# Patient Record
Sex: Male | Born: 1995 | Race: White | Hispanic: No | Marital: Single | Smoking: Never smoker
Health system: Southern US, Community
[De-identification: ages and names within clinical notes are randomized; demographics above are authoritative.]

## PROBLEM LIST (undated history)

## (undated) DIAGNOSIS — J020 Streptococcal pharyngitis: Secondary | ICD-10-CM

## (undated) DIAGNOSIS — H6692 Otitis media, unspecified, left ear: Secondary | ICD-10-CM

## (undated) DIAGNOSIS — Z8619 Personal history of other infectious and parasitic diseases: Secondary | ICD-10-CM

## (undated) DIAGNOSIS — F840 Autistic disorder: Secondary | ICD-10-CM

## (undated) DIAGNOSIS — F909 Attention-deficit hyperactivity disorder, unspecified type: Secondary | ICD-10-CM

## (undated) DIAGNOSIS — G2569 Other tics of organic origin: Secondary | ICD-10-CM

## (undated) HISTORY — DX: Other tics of organic origin: G25.69

## (undated) HISTORY — DX: Attention-deficit hyperactivity disorder, unspecified type: F90.9

## (undated) HISTORY — DX: Streptococcal pharyngitis: J02.0

## (undated) HISTORY — DX: Personal history of other infectious and parasitic diseases: Z86.19

## (undated) HISTORY — DX: Otitis media, unspecified, left ear: H66.92

## (undated) HISTORY — DX: Autistic disorder: F84.0

---
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
## 2013-02-12 ENCOUNTER — Telehealth: Payer: Self-pay

## 2013-02-12 DIAGNOSIS — F909 Attention-deficit hyperactivity disorder, unspecified type: Secondary | ICD-10-CM

## 2013-02-12 MED ORDER — AMPHETAMINE-DEXTROAMPHETAMINE 20 MG PO TABS: ORAL_TABLET | ORAL | Status: DC

## 2013-02-12 NOTE — Telephone Encounter (Signed)
Ronald Rivera stating that child needed refill on generic Adderall. She asked that it be mailed to the home. I called mom and let her know that it will be mailed out today and that it is a holiday weekend. She expressed understanding and said that she has enough to get him through until she receives it in the mail.

## 2013-03-25 ENCOUNTER — Telehealth: Payer: Self-pay

## 2013-03-25 DIAGNOSIS — F909 Attention-deficit hyperactivity disorder, unspecified type: Secondary | ICD-10-CM

## 2013-03-25 NOTE — Telephone Encounter (Signed)
[name] lvm stating that child needs Rx for his generic Adderall. She asked that it be mailed. I called and let her know this would be done tomorrow. She expressed understanding.

## 2013-03-26 MED ORDER — AMPHETAMINE-DEXTROAMPHETAMINE 20 MG PO TABS: ORAL_TABLET | ORAL | Status: DC

## 2013-03-26 NOTE — Telephone Encounter (Signed)
Mailed as requested.

## 2013-04-09 ENCOUNTER — Other Ambulatory Visit: Payer: Self-pay

## 2013-04-09 MED ORDER — CLONIDINE HCL 0.1 MG PO TABS: 0.1000 mg | ORAL_TABLET | Freq: Once | ORAL | Status: DC

## 2013-05-19 ENCOUNTER — Telehealth: Payer: Self-pay

## 2013-05-19 DIAGNOSIS — F909 Attention-deficit hyperactivity disorder, unspecified type: Secondary | ICD-10-CM

## 2013-05-19 MED ORDER — AMPHETAMINE-DEXTROAMPHETAMINE 20 MG PO TABS: ORAL_TABLET | ORAL | Status: DC

## 2013-05-19 NOTE — Telephone Encounter (Signed)
Ronald Rivera lvm asking for Rx to be mailed to her home. I called her back and let her know it will go out w tomorrow's mail.

## 2013-07-05 ENCOUNTER — Ambulatory Visit: Payer: 59 | Admitting: Family

## 2013-07-14 ENCOUNTER — Telehealth: Payer: Self-pay

## 2013-07-14 DIAGNOSIS — F909 Attention-deficit hyperactivity disorder, unspecified type: Secondary | ICD-10-CM

## 2013-07-14 MED ORDER — AMPHETAMINE-DEXTROAMPHETAMINE 20 MG PO TABS: ORAL_TABLET | ORAL | Status: DC

## 2013-07-14 NOTE — Telephone Encounter (Signed)
[name],GM, lvm asking for Rx for pt's generic Adderall 20 mg to be mailed to her home. Called her back and let her know we will send it out in the mail tomorrow bc mail has already ran for the day. She expressed understanding.

## 2013-07-26 ENCOUNTER — Ambulatory Visit (INDEPENDENT_AMBULATORY_CARE_PROVIDER_SITE_OTHER): Payer: 59 | Admitting: Family

## 2013-07-26 ENCOUNTER — Encounter: Payer: Self-pay | Admitting: Family

## 2013-07-26 VITALS — BP 108/74 | HR 74 | Ht 68.0 in | Wt 126.4 lb

## 2013-07-26 DIAGNOSIS — F848 Other pervasive developmental disorders: Secondary | ICD-10-CM

## 2013-07-26 DIAGNOSIS — F840 Autistic disorder: Secondary | ICD-10-CM

## 2013-07-26 DIAGNOSIS — G2569 Other tics of organic origin: Secondary | ICD-10-CM

## 2013-07-26 DIAGNOSIS — F909 Attention-deficit hyperactivity disorder, unspecified type: Secondary | ICD-10-CM

## 2013-07-26 NOTE — Progress Notes (Signed)
Patient: Ronald Rivera MRN: [id number] Sex: male DOB: 05/03/1996  Provider: Elveria Rising, NP Location of Care: [facility]  Note type: Routine return visit  History of Present Illness: Referral Source: Dr. Berniece Andreas History from: patient and his grandmother Chief Complaint: Asperger's Disorder/ADD/Motor Tic  Ronald Rivera is a 18 y.o. young man with Asperger syndrome, attention deficit disorder, and motor tic disorder. He has been healthy since last seen. He is in the last year of the [employer] program at [employer] and is looking forward to graduation in June. His family is going to [employer] after his graduation and he is excited about that as well. 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 Ronald Rivera plans to enroll at [employer] in the fall.   Ronald Rivera's grandmother says that he does not take Adderall on weekends and school holidays unless he has something to do that he needs attention and focus. She believes that he could likely reduce the dose or not take it for the purpose of employment. Ronald Rivera's tics have not been problematic. His grandmother are satisfied with his treatment plan at this time.  Review of Systems: 12 system review was unremarkable  Past Medical History  Diagnosis Date  . ADHD (attention deficit hyperactivity disorder)   . Autistic spectrum disorder     sees Dr Sharene Skeans  . Tics of organic origin     hx of  . STREPTOCOCCAL PHARYNGITIS 04/03/2010    Qualifier: Diagnosis of  By: Fabian Sharp MD, Neta Mends   . Hx of varicella   . Left acute otitis media 03/12/2012   Hospitalizations: no, Head Injury: no, Nervous System Infections: no, Immunizations up to date: yes Past Medical History Comments: no serious illness since last seen  Surgical History History reviewed. No pertinent past surgical history.  Family History family history includes Cancer in his paternal  grandmother. Family History is otherwise negative for migraines, seizures, cognitive impairment, blindness, deafness, birth defects, chromosomal disorder, autism.  Social History History   Social History  . Marital Status: Single    Spouse Name: N/A    Number of Children: N/A  . Years of Education: N/A   Social History Main Topics  . Smoking status: Never Smoker   . Smokeless tobacco: Never Used  . Alcohol Use: No  . Drug Use: No  . Sexual Activity: No   Other Topics Concern  . 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 He also enjoys playing video games. School comments: Eman is doing well in school. He graduates from [employer] in June 2015. 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   Physical Exam BP 108/74  Pulse 74  Ht 5\' 8"  (1.727 m)  Wt 126 lb 6.4 oz (57.335 kg)  BMI 19.22 kg/m2 General: alert, well developed, well nourished, blond hair, blue eyes, right-handed, in no acute distress  Head: normocephalic, no dysmorphic features  Ears, Nose and Throat: Otoscopic: tympanic membranes normal . Pharynx: oropharynx is pink without exudates or tonsillar hypertrophy.  Neck: supple, full range of motion, no  cranial or cervical bruits  Respiratory: auscultation clear  Cardiovascular: no murmurs, pulses are normal  Musculoskeletal: no skeletal deformities or apparent scoliosis  Skin: no rashes or neurocutaneous lesions   Neurologic Exam  Mental Status: alert; oriented to person, place; knowledge is below normal for age; language is somewhat concrete. He has a flat affect but was very pleasant and cooperative. eye  contact was intermittent  Cranial Nerves: visual fields are full to double simultaneous stimuli; extraocular movements are full and conjugate; pupils are round reactive to light; funduscopic examination shows sharp disc margins with normal vessels; symmetric facial strength; midline tongue and uvula; hearing is intact and symmetric  Motor: Normal strength, tone, and mass; good fine motor movements; no pronator drift.  Sensory: intact responses to touch and temperature  Coordination: good finger-to-nose, rapid repetitive alternating movements and finger apposition  Gait and Station: normal gait and station; patient is able to walk on heels, toes and tandem without difficulty; balance is adequate; Romberg exam is negative; Gower response is negative  Reflexes: symmetric and diminished bilaterally; no clonus; bilateral flexor plantar responses.   Assessment and Plan Ronald Rivera is a 18 year old young man with history of Asperger syndrome, attention deficit disorder and motor tic disorder. He is doing well at this time. We will make no changes in his treatment plan. He will return for follow up in 1 year or sooner if needed.

## 2013-07-28 ENCOUNTER — Encounter: Payer: Self-pay | Admitting: Family

## 2013-07-28 NOTE — Patient Instructions (Signed)
Congratulations on your upcoming graduation from [employer]! Continue your medications without change.  Please plan to return for follow up in 1 year or sooner if needed.

## 2013-08-11 ENCOUNTER — Telehealth: Payer: Self-pay

## 2013-08-11 DIAGNOSIS — F909 Attention-deficit hyperactivity disorder, unspecified type: Secondary | ICD-10-CM

## 2013-08-11 MED ORDER — AMPHETAMINE-DEXTROAMPHETAMINE 20 MG PO TABS: ORAL_TABLET | ORAL | Status: DC

## 2013-08-11 NOTE — Telephone Encounter (Signed)
Mailed as requested. It is late in the day so it will not go out until tomorrow, weather permitting.

## 2013-08-11 NOTE — Telephone Encounter (Signed)
[name], mom, lvm asking that Rx for pt's generic Adderall 20 mg be mailed to their home. Verified address. I called mom to confirm receipt of her message and let her know that we will put it in the mail. I informed her that the mail is a little delayed due to the weather, and that it may take longer to get there. She expressed understanding.

## 2013-08-16 ENCOUNTER — Other Ambulatory Visit: Payer: Self-pay | Admitting: Neurology

## 2013-10-11 ENCOUNTER — Telehealth: Payer: Self-pay

## 2013-10-11 DIAGNOSIS — F909 Attention-deficit hyperactivity disorder, unspecified type: Secondary | ICD-10-CM

## 2013-10-11 MED ORDER — AMPHETAMINE-DEXTROAMPHETAMINE 20 MG PO TABS: ORAL_TABLET | ORAL | Status: DC

## 2013-10-11 NOTE — Telephone Encounter (Signed)
Mailed as requested.

## 2013-10-11 NOTE — Telephone Encounter (Signed)
Malachi BondsGloria, mom, called and lvm asking for Rx to be mailed to the home. I called mom and let her know that it will be mailed as requested.

## 2013-11-11 ENCOUNTER — Ambulatory Visit (INDEPENDENT_AMBULATORY_CARE_PROVIDER_SITE_OTHER): Payer: 59 | Admitting: Internal Medicine

## 2013-11-11 ENCOUNTER — Encounter: Payer: Self-pay | Admitting: Internal Medicine

## 2013-11-11 VITALS — BP 110/70 | Temp 98.9°F | Ht 68.5 in | Wt 130.0 lb

## 2013-11-11 DIAGNOSIS — J3489 Other specified disorders of nose and nasal sinuses: Secondary | ICD-10-CM

## 2013-11-11 DIAGNOSIS — R0981 Nasal congestion: Secondary | ICD-10-CM

## 2013-11-11 DIAGNOSIS — H109 Unspecified conjunctivitis: Secondary | ICD-10-CM | POA: Insufficient documentation

## 2013-11-11 NOTE — Patient Instructions (Signed)
This doesn't seem like a bacterial infection Could be viral bvut this goes away on its own.  Could be allergic cause you also have nasal stuffiness.  Try OTC generic Claritin of zyrtec  Eye drop such as Optcon a or naphcon a  ( antihistamine ) 3-4 x per day  Or ketotifen every 8 hours   Allergic Conjunctivitis The conjunctiva is a thin membrane that covers the visible white part of the eyeball and the underside of the eyelids. This membrane protects and lubricates the eye. The membrane has small blood vessels running through it that can normally be seen. When the conjunctiva becomes inflamed, the condition is called conjunctivitis. In response to the inflammation, the conjunctival blood vessels become swollen. The swelling results in redness in the normally white part of the eye. The blood vessels of this membrane also react when a person has allergies and is then called allergic conjunctivitis. This condition usually lasts for as long as the allergy persists. Allergic conjunctivitis cannot be passed to another person (non-contagious). The likelihood of bacterial infection is great and the cause is not likely due to allergies if the inflamed eye has:  A sticky discharge.  Discharge or sticking together of the lids in the morning.  Scaling or flaking of the eyelids where the eyelashes come out.  Red swollen eyelids. CAUSES   Viruses.  Irritants such as foreign bodies.  Chemicals.  General allergic reactions.  Inflammation or serious diseases in the inside or the outside of the eye or the orbit (the boney cavity in which the eye sits) can cause a "red eye." SYMPTOMS   Eye redness.  Tearing.  Itchy eyes.  Burning feeling in the eyes.  Clear drainage from the eye.  Allergic reaction due to pollens or ragweed sensitivity. Seasonal allergic conjunctivitis is frequent in the spring when pollens are in the air and in the fall. DIAGNOSIS  This condition, in its many forms, is  usually diagnosed based on the history and an ophthalmological exam. It usually involves both eyes. If your eyes react at the same time every year, allergies may be the cause. While most "red eyes" are due to allergy or an infection, the role of an eye (ophthalmological) exam is important. The exam can rule out serious diseases of the eye or orbit. TREATMENT   Non-antibiotic eye drops, ointments, or medications by mouth may be prescribed if the ophthalmologist is sure the conjunctivitis is due to allergies alone.  Over-the-counter drops and ointments for allergic symptoms should be used only after other causes of conjunctivitis have been ruled out, or as your caregiver suggests. Medications by mouth are often prescribed if other allergy-related symptoms are present. If the ophthalmologist is sure that the conjunctivitis is due to allergies alone, treatment is normally limited to drops or ointments to reduce itching and burning. HOME CARE INSTRUCTIONS   Wash hands before and after applying drops or ointments, or touching the inflamed eye(s) or eyelids.  Do not let the eye dropper tip or ointment tube touch the eyelid when putting medicine in your eye.  Stop using your soft contact lenses and throw them away. Use a new pair of lenses when recovery is complete. You should run through sterilizing cycles at least three times before use after complete recovery if the old soft contact lenses are to be used. Hard contact lenses should be stopped. They need to be thoroughly sterilized before use after recovery.  Itching and burning eyes due to allergies is often relieved  by using a cool cloth applied to closed eye(s). SEEK MEDICAL CARE IF:   Your problems do not go away after two or three days of treatment.  Your lids are sticky (especially in the morning when you wake up) or stick together.  Discharge develops. Antibiotics may be needed either as drops, ointment, or by mouth.  You have extreme light  sensitivity.  An oral temperature above 102 F (38.9 C) develops.  Pain in or around the eye or any other visual symptom develops. MAKE SURE YOU:   Understand these instructions.  Will watch your condition.  Will get help right away if you are not doing well or get worse. Document Released: 08/24/2002 Document Revised: 08/26/2011 Document Reviewed: 07/20/2007 [facility]: [facility]onda W Patient Information 2014 [place], [place].

## 2013-11-11 NOTE — Progress Notes (Signed)
Chief Complaint  Patient presents with  . Eye Problem    Left eye is red.  Ongoing for 3-4 days.  Has tried OTC Visine.    HPI: Patient comes in today for SDA for  new problem evaluation. Here with grandmother Using visine for irritation. And going on 3-4 days but he says he has been sick Red left eye no pain or fever .   No discharge Congested .  Not sick with this  No change vision ono discharge a specific diagnosis of allergy but was in the yard a lot recently. ROS: See pertinent positives and negatives per HPI.  Past Medical History  Diagnosis Date  . ADHD (attention deficit hyperactivity disorder)   . Autistic spectrum disorder     sees Dr Sharene Skeans  . Tics of organic origin     hx of  . STREPTOCOCCAL PHARYNGITIS 04/03/2010    Qualifier: Diagnosis of  By: Fabian Sharp MD, Neta Mends   . Hx of varicella   . Left acute otitis media 03/12/2012    Family History  Problem Relation Age of Onset  . Cancer Paternal Grandmother     Died in her 25's    History   Social History  . Marital Status: Single    Spouse Name: N/A    Number of Children: N/A  . Years of Education: N/A   Social History Main Topics  . Smoking status: Never Smoker   . Smokeless tobacco: Never Used  . Alcohol Use: No  . Drug Use: No  . Sexual Activity: No   Other Topics Concern  . None   Social History Narrative   Single student   Gm helps with caretaking   [employer] 12th grade     Outpatient Encounter Prescriptions as of 11/11/2013  Medication Sig  . amphetamine-dextroamphetamine (ADDERALL) 20 MG tablet Take 1 1/2 tabs po qam &  1/2 tab po prn at 4PM  . cloNIDine (CATAPRES) 0.1 MG tablet TAKE 1/2 TABLET BY MOUTH EVERY MORNING & 1/2 TABLET EVERY EVENING    EXAM:  BP 110/70  Temp(Src) 98.9 F (37.2 C) (Oral)  Ht 5' 8.5" (1.74 m)  Wt 130 lb (58.968 kg)  BMI 19.48 kg/m2  Body mass index is 19.48 kg/(m^2).  GENERAL: vitals reviewed and listed above, alert, oriented, appears well hydrated  and in no acute distress with some mild conjunctival injection left greater than right as well HEENT: atraumatic, conjunctiva  no ciliary flush or discharge noted EOMs are full PERRLA no photophobia, no obvious abnormalities on inspection of external nose and ears is some mild nasal congestion OP : no lesion edema or exudate  NECK: no obvious masses on inspection palpation no adenopathy LUNGS: clear to auscultation bilaterally, no wheezes, rales or rhonchi CV: HRRR, no clubbing cyanosis or  peripheral edema nl cap refill  MS: moves all extremities without noticeable focal  abnormality PSYCH: pleasant and cooperative, slightly decreased eye contact ASSESSMENT AND PLAN:  Discussed the following assessment and plan:  Conjunctivitis unspecified - Allergic versus other it doesn't look bacterial discussed options be aware of rebound with antihistamines try OTC antihistamines  Nasal congestion  -Patient advised to return or notify health care team  if symptoms worsen ,persist or new concerns arise.  Patient Instructions  This doesn't seem like a bacterial infection Could be viral bvut this goes away on its own.  Could be allergic cause you also have nasal stuffiness.  Try OTC generic Claritin of zyrtec  Eye drop such  as Optcon a or naphcon a  ( antihistamine ) 3-4 x per day  Or ketotifen every 8 hours   Allergic Conjunctivitis The conjunctiva is a thin membrane that covers the visible white part of the eyeball and the underside of the eyelids. This membrane protects and lubricates the eye. The membrane has small blood vessels running through it that can normally be seen. When the conjunctiva becomes inflamed, the condition is called conjunctivitis. In response to the inflammation, the conjunctival blood vessels become swollen. The swelling results in redness in the normally white part of the eye. The blood vessels of this membrane also react when a person has allergies and is then called  allergic conjunctivitis. This condition usually lasts for as long as the allergy persists. Allergic conjunctivitis cannot be passed to another person (non-contagious). The likelihood of bacterial infection is great and the cause is not likely due to allergies if the inflamed eye has:  A sticky discharge.  Discharge or sticking together of the lids in the morning.  Scaling or flaking of the eyelids where the eyelashes come out.  Red swollen eyelids. CAUSES   Viruses.  Irritants such as foreign bodies.  Chemicals.  General allergic reactions.  Inflammation or serious diseases in the inside or the outside of the eye or the orbit (the boney cavity in which the eye sits) can cause a "red eye." SYMPTOMS   Eye redness.  Tearing.  Itchy eyes.  Burning feeling in the eyes.  Clear drainage from the eye.  Allergic reaction due to pollens or ragweed sensitivity. Seasonal allergic conjunctivitis is frequent in the spring when pollens are in the air and in the fall. DIAGNOSIS  This condition, in its many forms, is usually diagnosed based on the history and an ophthalmological exam. It usually involves both eyes. If your eyes react at the same time every year, allergies may be the cause. While most "red eyes" are due to allergy or an infection, the role of an eye (ophthalmological) exam is important. The exam can rule out serious diseases of the eye or orbit. TREATMENT   Non-antibiotic eye drops, ointments, or medications by mouth may be prescribed if the ophthalmologist is sure the conjunctivitis is due to allergies alone.  Over-the-counter drops and ointments for allergic symptoms should be used only after other causes of conjunctivitis have been ruled out, or as your caregiver suggests. Medications by mouth are often prescribed if other allergy-related symptoms are present. If the ophthalmologist is sure that the conjunctivitis is due to allergies alone, treatment is normally limited to  drops or ointments to reduce itching and burning. HOME CARE INSTRUCTIONS   Wash hands before and after applying drops or ointments, or touching the inflamed eye(s) or eyelids.  Do not let the eye dropper tip or ointment tube touch the eyelid when putting medicine in your eye.  Stop using your soft contact lenses and throw them away. Use a new pair of lenses when recovery is complete. You should run through sterilizing cycles at least three times before use after complete recovery if the old soft contact lenses are to be used. Hard contact lenses should be stopped. They need to be thoroughly sterilized before use after recovery.  Itching and burning eyes due to allergies is often relieved by using a cool cloth applied to closed eye(s). SEEK MEDICAL CARE IF:   Your problems do not go away after two or three days of treatment.  Your lids are sticky (especially in the morning  when you wake up) or stick together.  Discharge develops. Antibiotics may be needed either as drops, ointment, or by mouth.  You have extreme light sensitivity.  An oral temperature above 102 F (38.9 C) develops.  Pain in or around the eye or any other visual symptom develops. MAKE SURE YOU:   Understand these instructions.  Will watch your condition.  Will get help right away if you are not doing well or get worse. Document Released: 08/24/2002 Document Revised: 08/26/2011 Document Reviewed: 07/20/2007 [facility] Patient Information 2014 [place], [place].      Neta Mends. Asier Desroches M.D.  Pre visit review using our clinic review tool, if applicable. No additional management support is needed unless otherwise documented below in the visit note.

## 2013-11-23 ENCOUNTER — Telehealth: Payer: Self-pay | Admitting: Internal Medicine

## 2013-11-23 MED ORDER — POLYMYXIN B-TRIMETHOPRIM 10000-0.1 UNIT/ML-% OP SOLN: 1.0000 [drp] | OPHTHALMIC | Status: DC

## 2013-11-23 NOTE — Telephone Encounter (Signed)
Spoke to the patient's grandmother.  She says the pt has no fever or vision changes. Medication sent to [facility] [address].

## 2013-11-23 NOTE — Telephone Encounter (Signed)
Pt instructed to cb concerning eye. Grandmother states that pt's eye is not better, still red and discharge is coming from eye. Will need the rx dropps. [facility]

## 2013-11-23 NOTE — Telephone Encounter (Signed)
If no fever or vision change  Send in polytrim eye drops 1 drop qid to affected eye  disp 1 bottle  If still not better may need rov or eye doctor check

## 2013-12-16 ENCOUNTER — Telehealth: Payer: Self-pay

## 2013-12-16 DIAGNOSIS — F909 Attention-deficit hyperactivity disorder, unspecified type: Secondary | ICD-10-CM

## 2013-12-16 MED ORDER — AMPHETAMINE-DEXTROAMPHETAMINE 20 MG PO TABS: ORAL_TABLET | ORAL | Status: DC

## 2013-12-16 NOTE — Telephone Encounter (Signed)
Ronald Rivera lvm asking that pt's Rx for generic Adderall 20 mg be mailed to their home. I called mom to remind her that it is a holiday weekend and the mail may be delayed. She expressed understanding.

## 2014-02-10 ENCOUNTER — Other Ambulatory Visit: Payer: Self-pay | Admitting: Family

## 2014-02-10 DIAGNOSIS — F909 Attention-deficit hyperactivity disorder, unspecified type: Secondary | ICD-10-CM

## 2014-02-10 MED ORDER — AMPHETAMINE-DEXTROAMPHETAMINE 20 MG PO TABS: ORAL_TABLET | ORAL | Status: DC

## 2014-02-10 NOTE — Telephone Encounter (Signed)
Mom left a message requesting that the generic Adderall Rx be mailed. I called and let her know that I would send her the Rx. TG

## 2014-04-28 ENCOUNTER — Telehealth: Payer: Self-pay

## 2014-04-28 DIAGNOSIS — F909 Attention-deficit hyperactivity disorder, unspecified type: Secondary | ICD-10-CM

## 2014-04-28 MED ORDER — AMPHETAMINE-DEXTROAMPHETAMINE 20 MG PO TABS: ORAL_TABLET | ORAL | Status: DC

## 2014-04-28 NOTE — Telephone Encounter (Signed)
Mailed as requested.

## 2014-04-28 NOTE — Telephone Encounter (Signed)
Malachi BondsGloria, mom, requesting Rx for child generic Adderall to be mailed to their home. I confirmed address and let her know we will place in the mail as requested.

## 2014-06-27 ENCOUNTER — Other Ambulatory Visit: Payer: Self-pay

## 2014-06-27 DIAGNOSIS — F909 Attention-deficit hyperactivity disorder, unspecified type: Secondary | ICD-10-CM

## 2014-06-27 MED ORDER — AMPHETAMINE-DEXTROAMPHETAMINE 20 MG PO TABS: ORAL_TABLET | ORAL | Status: DC

## 2014-06-27 NOTE — Telephone Encounter (Signed)
Malachi BondsGloria, called and asked that we mail Rx for pt's Adderall 20 mg. I scheduled pt's f/u while I had her on the phone, 07/27/14 w Inetta Fermoina.

## 2014-06-27 NOTE — Telephone Encounter (Signed)
Mailed as requested.

## 2014-07-27 ENCOUNTER — Ambulatory Visit (INDEPENDENT_AMBULATORY_CARE_PROVIDER_SITE_OTHER): Payer: 59 | Admitting: Family

## 2014-07-27 ENCOUNTER — Encounter: Payer: Self-pay | Admitting: Family

## 2014-07-27 VITALS — BP 110/76 | HR 80 | Ht 67.75 in | Wt 131.4 lb

## 2014-07-27 DIAGNOSIS — F909 Attention-deficit hyperactivity disorder, unspecified type: Secondary | ICD-10-CM | POA: Diagnosis not present

## 2014-07-27 DIAGNOSIS — G2569 Other tics of organic origin: Secondary | ICD-10-CM

## 2014-07-27 DIAGNOSIS — F840 Autistic disorder: Secondary | ICD-10-CM

## 2014-07-27 MED ORDER — AMPHETAMINE-DEXTROAMPHETAMINE 20 MG PO TABS: ORAL_TABLET | ORAL | Status: DC

## 2014-07-27 MED ORDER — CLONIDINE HCL 0.1 MG PO TABS: ORAL_TABLET | ORAL | Status: DC

## 2014-07-27 NOTE — Patient Instructions (Signed)
Ronald JunesBrandon should continues his generic Adderall and Clonidine as he has been taking them.  I will see him back in follow up in 1 year or sooner if needed.

## 2014-07-27 NOTE — Progress Notes (Signed)
Patient: Ronald Rivera Remmel MRN: [id number] Sex: male DOB: 04/24/1996  Provider: Elveria RisingGOODPASTURE, Shela Esses, NP [facility]  Note type: Routine return visit  History of Present Illness: Referral Source: Dr. Burna MortimerWanda Posh History from: his grandmother Chief Complaint: Tics, Autism, ADHD  Ronald Rivera Self is a 19 y.o. with history of Asperger syndrome, attention deficit disorder, and motor tic disorder. He was last seen July 26, 2013. 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 Ronald Rivera now has a checking and savings account, and his grandmother says that he is managing both independently. Ronald Rivera continues to take generic Adderall and Clonidine for his problems with attention. Grandmother says that these medications continue to work to help him focus his attention in his job. She sometimes does not give it to him on weekends, and says that without it that he is very distractible and active.   Ronald Rivera has been healthy since last seen. He has history of tics but they are not problematic at this time.   Review of Systems: 12 system review was unremarkable  Past Medical History  Diagnosis Date  . ADHD (attention deficit hyperactivity disorder)   . Autistic spectrum disorder     sees Dr Sharene SkeansHickling  . Tics of organic origin     hx of  . STREPTOCOCCAL PHARYNGITIS 04/03/2010    Qualifier: Diagnosis of  By: Fabian SharpPanosh MD, Neta MendsWanda K   . Hx of varicella   . Left acute otitis media 03/12/2012   Hospitalizations: No., Head Injury: No., Nervous System Infections: No., Immunizations up to date: Yes.   Past Medical History Comments: See hx.  Surgical History History reviewed. No pertinent past surgical history.  Family History family history includes Cancer in his paternal grandmother. Family History is otherwise negative for  migraines, seizures, cognitive impairment, blindness, deafness, birth defects, chromosomal disorder, autism.  Social History History   Social History  . Marital Status: Single    Spouse Name: N/A  . Number of Children: N/A  . Years of Education: N/A   Social History Main Topics  . Smoking status: Never Smoker   . Smokeless tobacco: Never Used  . Alcohol Use: No  . Drug Use: No  . Sexual Activity: No   Other Topics Concern  . 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 Pharynx: oropharynx is pink without exudates or tonsillar hypertrophy.  Neck: supple, full range of motion, no cranial or cervical bruits  Respiratory: auscultation clear  Cardiovascular: no murmurs, pulses are normal  Musculoskeletal: no skeletal deformities or apparent scoliosis  Skin: no rashes or neurocutaneous lesions   Neurologic Exam  Mental Status: alert; oriented to person, place; knowledge is below normal for age; language is somewhat concrete. He  has a flat affect but was very pleasant and cooperative. Eye contact was intermittent  Cranial Nerves: visual fields are full to double simultaneous stimuli; extraocular movements are full and conjugate; pupils are round reactive to light; funduscopic examination shows sharp disc margins with normal vessels;  symmetric facial strength; midline tongue and uvula; hearing is intact and symmetric  Motor: Normal strength, tone, and mass; good fine motor movements; no pronator drift.  Sensory: intact responses to touch and temperature  Coordination: good finger-to-nose, rapid repetitive alternating movements and finger apposition  Gait and Station: normal gait and station; patient is able to walk on heels, toes and tandem without difficulty; balance is adequate; Romberg exam is negative; Gower response is negative  Reflexes: symmetric and diminished bilaterally; no clonus; bilateral flexor plantar responses.   Assessment and Plan Ronald Junes is a 19 year old young man with history of Asperger syndrome, attention deficit disorder and motor tic disorder. He is doing well at this time on generic Adderall and Clonidine. We will make no changes in his treatment plan. He will return for follow up in 1 year or sooner if needed. Ronald Junes and his grandmother agreed with these plans.

## 2014-09-13 ENCOUNTER — Emergency Department (HOSPITAL_COMMUNITY)
Admission: EM | Admit: 2014-09-13 | Discharge: 2014-09-13 | Disposition: A | Discharge status: Home / Self Care | Payer: 59 | Attending: Emergency Medicine | Admitting: Emergency Medicine

## 2014-09-13 ENCOUNTER — Telehealth: Payer: Self-pay | Admitting: Internal Medicine

## 2014-09-13 ENCOUNTER — Emergency Department (HOSPITAL_COMMUNITY): Payer: 59

## 2014-09-13 ENCOUNTER — Encounter (HOSPITAL_COMMUNITY): Payer: Self-pay | Admitting: Emergency Medicine

## 2014-09-13 DIAGNOSIS — Z8669 Personal history of other diseases of the nervous system and sense organs: Secondary | ICD-10-CM | POA: Insufficient documentation

## 2014-09-13 DIAGNOSIS — Z8619 Personal history of other infectious and parasitic diseases: Secondary | ICD-10-CM | POA: Insufficient documentation

## 2014-09-13 DIAGNOSIS — R1013 Epigastric pain: Secondary | ICD-10-CM | POA: Diagnosis present

## 2014-09-13 DIAGNOSIS — F840 Autistic disorder: Secondary | ICD-10-CM | POA: Diagnosis not present

## 2014-09-13 DIAGNOSIS — Z79899 Other long term (current) drug therapy: Secondary | ICD-10-CM | POA: Diagnosis not present

## 2014-09-13 DIAGNOSIS — F909 Attention-deficit hyperactivity disorder, unspecified type: Secondary | ICD-10-CM | POA: Diagnosis not present

## 2014-09-13 DIAGNOSIS — K529 Noninfective gastroenteritis and colitis, unspecified: Secondary | ICD-10-CM | POA: Diagnosis not present

## 2014-09-13 DIAGNOSIS — Z8709 Personal history of other diseases of the respiratory system: Secondary | ICD-10-CM | POA: Diagnosis not present

## 2014-09-13 LAB — CBC WITH DIFFERENTIAL/PLATELET
Basophils Absolute: 0 10*3/uL (ref 0.0–0.1)
Basophils Relative: 0 % (ref 0–1)
EOS PCT: 0 % (ref 0–5)
Eosinophils Absolute: 0 10*3/uL (ref 0.0–0.7)
HCT: 46.4 % (ref 39.0–52.0)
Hemoglobin: 15.8 g/dL (ref 13.0–17.0)
LYMPHS PCT: 5 % — AB (ref 12–46)
Lymphs Abs: 1 10*3/uL (ref 0.7–4.0)
MCH: 29.5 pg (ref 26.0–34.0)
MCHC: 34.1 g/dL (ref 30.0–36.0)
MCV: 86.6 fL (ref 78.0–100.0)
Monocytes Absolute: 0.9 10*3/uL (ref 0.1–1.0)
Monocytes Relative: 5 % (ref 3–12)
Neutro Abs: 17.3 10*3/uL — ABNORMAL HIGH (ref 1.7–7.7)
Neutrophils Relative %: 90 % — ABNORMAL HIGH (ref 43–77)
PLATELETS: 255 10*3/uL (ref 150–400)
RBC: 5.36 MIL/uL (ref 4.22–5.81)
RDW: 13 % (ref 11.5–15.5)
WBC: 19.2 10*3/uL — ABNORMAL HIGH (ref 4.0–10.5)

## 2014-09-13 LAB — COMPREHENSIVE METABOLIC PANEL
ALBUMIN: 4.5 g/dL (ref 3.5–5.2)
ALT: 16 U/L (ref 0–53)
ANION GAP: 13 (ref 5–15)
AST: 25 U/L (ref 0–37)
Alkaline Phosphatase: 77 U/L (ref 39–117)
BILIRUBIN TOTAL: 0.6 mg/dL (ref 0.3–1.2)
BUN: 20 mg/dL (ref 6–23)
CALCIUM: 9.9 mg/dL (ref 8.4–10.5)
CO2: 23 mmol/L (ref 19–32)
Chloride: 102 mmol/L (ref 96–112)
Creatinine, Ser: 1.1 mg/dL (ref 0.50–1.35)
GFR calc Af Amer: >90 mL/min (ref >90)
GLUCOSE: 191 mg/dL — AB (ref 70–99)
Potassium: 4.4 mmol/L (ref 3.5–5.1)
Sodium: 138 mmol/L (ref 135–145)
Total Protein: 7.2 g/dL (ref 6.0–8.3)

## 2014-09-13 MED ORDER — IOHEXOL 300 MG/ML  SOLN
25.0000 mL | Freq: Once | INTRAMUSCULAR | Status: AC | PRN
  Administered 2014-09-13: 25 mL via ORAL

## 2014-09-13 MED ORDER — ONDANSETRON 4 MG PO TBDP: 4.0000 mg | ORAL_TABLET | Freq: Three times a day (TID) | ORAL | Status: DC | PRN

## 2014-09-13 MED ORDER — METRONIDAZOLE 500 MG PO TABS: 500.0000 mg | ORAL_TABLET | Freq: Two times a day (BID) | ORAL | Status: DC

## 2014-09-13 MED ORDER — IOHEXOL 300 MG/ML  SOLN
100.0000 mL | Freq: Once | INTRAMUSCULAR | Status: AC | PRN
  Administered 2014-09-13: 100 mL via INTRAVENOUS

## 2014-09-13 MED ORDER — ONDANSETRON HCL 4 MG/2ML IJ SOLN
4.0000 mg | Freq: Once | INTRAMUSCULAR | Status: AC
  Administered 2014-09-13: 4 mg via INTRAVENOUS
  Filled 2014-09-13: qty 2

## 2014-09-13 MED ORDER — CIPROFLOXACIN HCL 500 MG PO TABS: 500.0000 mg | ORAL_TABLET | Freq: Two times a day (BID) | ORAL | Status: DC

## 2014-09-13 MED ORDER — SODIUM CHLORIDE 0.9 % IV BOLUS (SEPSIS)
2000.0000 mL | Freq: Once | INTRAVENOUS | Status: AC
  Administered 2014-09-13: 2000 mL via INTRAVENOUS

## 2014-09-13 NOTE — ED Provider Notes (Signed)
CSN: [id number]     Arrival date & time 09/13/14  1622 History   First MD Initiated Contact with Patient 09/13/14 1632     Chief Complaint  Patient presents with  . Abdominal Pain     (Consider location/radiation/quality/duration/timing/severity/associated sxs/prior Treatment) Patient is a 19 y.o. male presenting with vomiting. The history is provided by the patient and a parent.  Emesis Severity:  Moderate Duration:  15 hours Timing:  Constant Quality:  Stomach contents Progression:  Unchanged Chronicity:  New Recent urination:  Decreased Relieved by:  None tried Worsened by:  Nothing tried Ineffective treatments:  None tried Associated symptoms: abdominal pain (epigastric, improved)   Associated symptoms: no diarrhea, no fever, no headaches, no sore throat and no URI     Past Medical History  Diagnosis Date  . ADHD (attention deficit hyperactivity disorder)   . Autistic spectrum disorder     sees Dr Sharene Skeans  . Tics of organic origin     hx of  . STREPTOCOCCAL PHARYNGITIS 04/03/2010    Qualifier: Diagnosis of  By: Fabian Sharp MD, Neta Mends   . Hx of varicella   . Left acute otitis media 03/12/2012   History reviewed. No pertinent past surgical history. Family History  Problem Relation Age of Onset  . Cancer Paternal Grandmother     Died in her 56's   History  Substance Use Topics  . Smoking status: Never Smoker   . Smokeless tobacco: Never Used  . Alcohol Use: No    Review of Systems  HENT: Negative for sore throat.   Gastrointestinal: Positive for vomiting and abdominal pain (epigastric, improved). Negative for diarrhea.  Neurological: Negative for headaches.  All other systems reviewed and are negative.     Allergies  Aripiprazole  Home Medications   Prior to Admission medications   Medication Sig Start Date End Date Taking? Authorizing Provider  acetaminophen (TYLENOL) 500 MG tablet Take 1,000 mg by mouth every 6 (six) hours as needed for headache.    Yes Historical Provider, MD  amphetamine-dextroamphetamine (ADDERALL) 20 MG tablet Take 10 mg by mouth daily.   Yes Historical Provider, MD  cloNIDine (CATAPRES) 0.1 MG tablet Take 0.05 mg by mouth daily.   Yes Historical Provider, MD  amphetamine-dextroamphetamine (ADDERALL) 20 MG tablet Take 1 1/2 tabs po qam &  1/2 tab po prn at 4PM 07/27/14   Princella Ion, NP  ciprofloxacin (CIPRO) 500 MG tablet Take 1 tablet (500 mg total) by mouth every 12 (twelve) hours. 09/13/14   Dorna Leitz, MD  cloNIDine (CATAPRES) 0.1 MG tablet TAKE 1/2 TABLET BY MOUTH EVERY MORNING & 1/2 TABLET EVERY EVENING 07/27/14   Princella Ion, NP  metroNIDAZOLE (FLAGYL) 500 MG tablet Take 1 tablet (500 mg total) by mouth 2 (two) times daily. 09/13/14   Dorna Leitz, MD  ondansetron (ZOFRAN ODT) 4 MG disintegrating tablet Take 1 tablet (4 mg total) by mouth every 8 (eight) hours as needed for nausea. 09/13/14   Dorna Leitz, MD  trimethoprim-polymyxin b (POLYTRIM) ophthalmic solution Place 1 drop into the left eye every 4 (four) hours. Patient not taking: Reported on 07/27/2014 11/23/13   [name], MD   BP 111/62 mmHg  Pulse 90  Temp(Src) 98.3 F (36.8 C) (Oral)  Resp 16  SpO2 96% Physical Exam  Constitutional: He is oriented to person, place, and time. He appears well-developed and well-nourished. No distress.  Pale, clammy, diaphoretic. No acute distress on my exam.  HENT:  Head: Normocephalic and  atraumatic.  Mouth/Throat: No oropharyngeal exudate.  Mucous membranes dry  Eyes: Conjunctivae and EOM are normal. Pupils are equal, round, and reactive to light.  Neck: Normal range of motion. Neck supple.  Cardiovascular: Normal rate, regular rhythm, normal heart sounds and intact distal pulses.  Exam reveals no gallop and no friction rub.   No murmur heard. Pulmonary/Chest: Effort normal and breath sounds normal. No respiratory distress. He has no wheezes. He has no rales.  Abdominal: Soft. He exhibits no  distension and no mass. There is no tenderness. There is no rebound and no guarding.  Nontender throughout abdomen to deep palpation  Musculoskeletal: Normal range of motion. He exhibits no edema or tenderness.  Lymphadenopathy:    He has no cervical adenopathy.  Neurological: He is alert and oriented to person, place, and time. No cranial nerve deficit.  Skin: Skin is warm. No rash noted. He is diaphoretic. There is pallor.  Psychiatric: He has a normal mood and affect. His behavior is normal. Judgment and thought content normal.  Nursing note and vitals reviewed.   ED Course  Procedures (including critical care time) Labs Review Labs Reviewed  CBC WITH DIFFERENTIAL/PLATELET - Abnormal; Notable for the following:    WBC 19.2 (*)    Neutrophils Relative % 90 (*)    Neutro Abs 17.3 (*)    Lymphocytes Relative 5 (*)    All other components within normal limits  COMPREHENSIVE METABOLIC PANEL - Abnormal; Notable for the following:    Glucose, Bld 191 (*)    All other components within normal limits    Imaging Review Ct Abdomen Pelvis W Contrast  09/13/2014   CLINICAL DATA:  Diffuse abdominal pain, with nausea and vomiting for 1 day.  EXAM: CT ABDOMEN AND PELVIS WITH CONTRAST  TECHNIQUE: Multidetector CT imaging of the abdomen and pelvis was performed using the standard protocol following bolus administration of intravenous contrast.  CONTRAST:  OMNIPAQUE IOHEXOL 300 MG/ML  SOLN  COMPARISON:  None.  FINDINGS: The included lung bases are clear.  The gallbladder is physiologically distended, there is probable gallbladder wall thickening measuring up to 5.5 mm. No definite calcified gallstone. The common bile duct appears normal in caliber, there is mild periportal edema. No focal hepatic lesion. The spleen, pancreas, and adrenal glands are normal. The kidneys are symmetric in size with symmetric enhancement. No hydronephrosis or localizing renal abnormality.  Stomach is physiologically  distended with contrast. There are no dilated or thickened bowel loops. The appendix is normal, best appreciated on coronal reformat. There is mild colonic wall thickening involving the ascending colon. Small volume of stool throughout the remainder the colon. No free air, free fluid, or intra-abdominal fluid collection.  Abdominal aorta is normal in caliber. No retroperitoneal adenopathy.  Within the pelvis the urinary bladder is physiologically distended. There is no pelvic free fluid. No pelvic adenopathy.  There are no acute or suspicious osseous abnormalities.  IMPRESSION: 1. Mild colonic wall thickening of the ascending colon consistent with colitis. This may be infectious or inflammatory. 2. Physiologically distended gallbladder with mild gallbladder wall thickening. This may be reactive related to the colonic process, however right upper quadrant ultrasound could be considered if there is clinical concern for hepatobiliary pathology.   Electronically Signed   By: Rubye Oaks M.D.   On: 09/13/2014 20:15     EKG Interpretation None      MDM   Final diagnoses:  Gastroenteritis  Colitis   19 year old male presents with nausea and  vomiting since 1:00 AM this morning. No new exposures and no sick contacts. Denies diarrhea. Did have epigastric abdominal pain with vomiting that has improved at this point. He has no abdominal tenderness on my exam throughout. At this point, we'll defer imaging as this appears to be a gastritis. Lack of abdominal tenderness or vital sign abnormality makes acute abdominal process much less likely. We'll do serial abdominal exams throughout emergency department stay. Afebrile and vital signs stable. Fluid hydration, Zofran, basic labs.  5:46 PM  repeat abdominal exam remains benign to deep palpation throughout. Leukocytosis likely stress reactive in nature as he is also hyperglycemic. Symptomatically much improved with fluids and Zofran. Vital signs remain stable.  No further diaphoresis and nausea is improved. Given possible unreliability of history given autism and report of right lower quadrant pain from grandmother, CT abdomen and pelvis ordered.  9:00 PM CT reflective of colitis. On repeat exam, patient tolerating by mouth and much improved. Likely viral, but cannot rule out bacterial and we'll treat with Cipro Flagyl in addition to Zofran and oral rehydration. PCP follow-up.   Dorna LeitzAlex Prakriti Carignan, MD 09/13/14 [id number]  Blake DivineJohn Wofford, MD 09/13/14 [phone]

## 2014-09-13 NOTE — ED Notes (Signed)
Per patient, started having abdominal pain generalized since this morning. Pt is pale and diaphoretic, AAOX4

## 2014-09-13 NOTE — ED Notes (Signed)
Pt tolerating PO fluids

## 2014-09-13 NOTE — ED Provider Notes (Signed)
I saw and evaluated the patient, reviewed the resident's note and I agree with the findings and plan.   EKG Interpretation None      19 yo male with Autism presenting with vomiting starting early this morning.  On exam, well appearing, nontoxic, not distressed, normal respiratory effort, normal perfusion, abdomen soft, but mildly tender throughout, mild guarding, no rebound or rigidity.  Feels much better after IV fluids, but history is somewhat difficult to obtain from him.  His grandmother reports significant abdominal pain earlier.  Also has leukocytosis.  Plan CT.    CT shows colitis. DC with antibiotic.  Clinical Impression: 1. Gastroenteritis   2. Colitis       Blake DivineJohn Maximos Zayas, MD 09/13/14 [phone]

## 2014-09-13 NOTE — Discharge Instructions (Signed)

## 2014-09-13 NOTE — Telephone Encounter (Incomplete)
[facility] - Client TELEPHONE ADVICE RECORD [facility] Patient Name: Ronald Rivera DOB: 06/12/1996 Initial Comment ***Next Attempt Due at 3:05pm***Caller states her grandson has been vomiting since this morning. Nurse Assessment Nurse: Orvis Brillowland, RN, Olegario MessierKathy Date/Time Ronald Rivera(Eastern Time): 09/13/2014 3:31:19 PM Confirm and document reason for call. If symptomatic, describe symptoms. ---Caller states that he had onset of vomiting without diarrhea approx 0100 today & has been vomiting every 45" since then. Had onset of constant abd pain this AM, is below navel area. No fever. Has the patient traveled out of the country within the last 30 days? ---No Does the patient require triage? ---Yes Related visit to physician within the last 2 weeks? ---No Does the PT have any chronic conditions? (i.e. diabetes, asthma, etc.) ---Yes List chronic conditions. ---Asperger's Syndrome Guidelines Guideline Title Affirmed Question Affirmed Notes Abdominal Pain - Male [1] MILD-MODERATE pain AND [2] constant AND [3] present > 2 hours Final Disposition User See Physician within 4 Hours (or PCP triage) Orvis Brillowland, RN, Olegario MessierKathy Comments Caller states she missed a call from the office Triager did not attempt to schedule appt secondary to time call back made & fact that caller has been vomiting & having constant abd pain since early this AM.

## 2014-09-13 NOTE — ED Notes (Signed)
CT notified patient finished with contrast 

## 2014-09-26 ENCOUNTER — Telehealth: Payer: Self-pay

## 2014-09-26 DIAGNOSIS — F909 Attention-deficit hyperactivity disorder, unspecified type: Secondary | ICD-10-CM

## 2014-09-26 MED ORDER — AMPHETAMINE-DEXTROAMPHETAMINE 20 MG PO TABS: ORAL_TABLET | ORAL | Status: DC

## 2014-09-26 NOTE — Telephone Encounter (Signed)
Mailed as requested.

## 2014-09-26 NOTE — Telephone Encounter (Signed)
Ronald BondsGloria called and requested pt's Rx for generic Adderall 20 mg to be mailed to their home. She confirmed sig, and address. I let her know that we will mail as requested.

## 2014-09-27 ENCOUNTER — Ambulatory Visit (INDEPENDENT_AMBULATORY_CARE_PROVIDER_SITE_OTHER): Payer: 59 | Admitting: Internal Medicine

## 2014-09-27 ENCOUNTER — Encounter: Payer: Self-pay | Admitting: Internal Medicine

## 2014-09-27 VITALS — BP 114/64 | Temp 98.5°F | Ht 67.75 in | Wt 132.0 lb

## 2014-09-27 DIAGNOSIS — D72829 Elevated white blood cell count, unspecified: Secondary | ICD-10-CM | POA: Diagnosis not present

## 2014-09-27 DIAGNOSIS — K529 Noninfective gastroenteritis and colitis, unspecified: Secondary | ICD-10-CM | POA: Diagnosis not present

## 2014-09-27 LAB — CBC WITH DIFFERENTIAL/PLATELET
Basophils Absolute: 0.1 10*3/uL (ref 0.0–0.1)
Basophils Relative: 0.7 % (ref 0.0–3.0)
EOS ABS: 0.2 10*3/uL (ref 0.0–0.7)
Eosinophils Relative: 2.2 % (ref 0.0–5.0)
HCT: 46 % (ref 39.0–52.0)
Hemoglobin: 15.5 g/dL (ref 13.0–17.0)
Lymphocytes Relative: 21.4 % (ref 12.0–46.0)
Lymphs Abs: 2 10*3/uL (ref 0.7–4.0)
MCHC: 33.7 g/dL (ref 30.0–36.0)
MCV: 85.9 fl (ref 78.0–100.0)
MONO ABS: 1 10*3/uL (ref 0.1–1.0)
Monocytes Relative: 10.2 % (ref 3.0–12.0)
NEUTROS PCT: 65.5 % (ref 43.0–77.0)
Neutro Abs: 6.2 10*3/uL (ref 1.4–7.7)
PLATELETS: 351 10*3/uL (ref 150.0–400.0)
RBC: 5.36 Mil/uL (ref 4.22–5.81)
RDW: 13.6 % (ref 11.5–14.6)
WBC: 9.5 10*3/uL (ref 4.5–10.5)

## 2014-09-27 LAB — LIPID PANEL
CHOLESTEROL: 140 mg/dL (ref 0–200)
HDL: 35.5 mg/dL — AB (ref >39.00)
LDL CALC: 88 mg/dL (ref 0–99)
NonHDL: 104.5
Total CHOL/HDL Ratio: 4
Triglycerides: 83 mg/dL (ref 0.0–149.0)
VLDL: 16.6 mg/dL (ref 0.0–40.0)

## 2014-09-27 LAB — SEDIMENTATION RATE: SED RATE: 2 mm/h (ref 0–22)

## 2014-09-27 NOTE — Patient Instructions (Signed)
This may be just a bad bowel infection and may never come back  However   If any recurrence we need to have you see GI specialist. Repeating lab today to make sure down to normal also. Recheck at any   Gi concerns .   Wt Readings from Last 3 Encounters:  09/27/14 132 lb (59.875 kg)  07/27/14 131 lb 6.4 oz (59.603 kg)  11/11/13 130 lb (58.968 kg) (11 %*, Z = -1.21)   * Growth percentiles are based on CDC 2-20 Years data.

## 2014-09-27 NOTE — Progress Notes (Signed)
Pre visit review using our clinic review tool, if applicable. No additional management support is needed unless otherwise documented below in the visit note.  Chief Complaint  Patient presents with  . ED Follow Up    gi illness    HPI: Ronald Rivera 18 y.o. Patient come in for follow up from ED visit with GM  for  a cute abd pain and  Dx with colitis  Had rx with antibiotic   And some diarrhea later.   2 weeks ago.   Had onset of  . Vomiting all day long    When sent.  Others got sick in the family    But better. More quickly vomiting and or diarrhea  Now Pain is gone  Normal now  Eating better   Back to normal no fever.  May have had some at onset .  No hx of same  No travel exposures otherwise  Was dehydrated  ROS: See pertinent positives and negatives per HPI. No cps sob fever rash weight loss currently  Past Medical History  Diagnosis Date  . ADHD (attention deficit hyperactivity disorder)   . Autistic spectrum disorder     sees Dr Sharene Skeans  . Tics of organic origin     hx of  . STREPTOCOCCAL PHARYNGITIS 04/03/2010    Qualifier: Diagnosis of  By: Fabian Sharp MD, Neta Mends   . Hx of varicella   . Left acute otitis media 03/12/2012    Family History  Problem Relation Age of Onset  . Cancer Paternal Grandmother     Died in her 17's    History   Social History  . Marital Status: Single    Spouse Name: N/A  . Number of Children: N/A  . Years of Education: N/A   Social History Main Topics  . Smoking status: Never Smoker   . Smokeless tobacco: Never Used  . Alcohol Use: No  . Drug Use: No  . Sexual Activity: No   Other Topics Concern  . None   Social History Narrative   Single student   Gm helps with caretaking   [employer] 12th grade     Outpatient Encounter Prescriptions as of 09/27/2014  Medication Sig  . acetaminophen (TYLENOL) 500 MG tablet Take 1,000 mg by mouth every 6 (six) hours as needed for headache.  . amphetamine-dextroamphetamine  (ADDERALL) 20 MG tablet Take 1 1/2 tabs po qam &  1/2 tab po prn at 4PM (Patient not taking: Reported on 09/27/2014)  . cloNIDine (CATAPRES) 0.1 MG tablet TAKE 1/2 TABLET BY MOUTH EVERY MORNING & 1/2 TABLET EVERY EVENING (Patient not taking: Reported on 09/27/2014)  . cloNIDine (CATAPRES) 0.1 MG tablet Take 0.05 mg by mouth daily.  . [DISCONTINUED] ciprofloxacin (CIPRO) 500 MG tablet Take 1 tablet (500 mg total) by mouth every 12 (twelve) hours.  . [DISCONTINUED] metroNIDAZOLE (FLAGYL) 500 MG tablet Take 1 tablet (500 mg total) by mouth 2 (two) times daily.  . [DISCONTINUED] ondansetron (ZOFRAN ODT) 4 MG disintegrating tablet Take 1 tablet (4 mg total) by mouth every 8 (eight) hours as needed for nausea.  . [DISCONTINUED] trimethoprim-polymyxin b (POLYTRIM) ophthalmic solution Place 1 drop into the left eye every 4 (four) hours. (Patient not taking: Reported on 07/27/2014)    EXAM:  BP 114/64 mmHg  Temp(Src) 98.5 F (36.9 C) (Oral)  Ht 5' 7.75" (1.721 m)  Wt 132 lb (59.875 kg)  BMI 20.22 kg/m2  Body mass index is 20.22 kg/(m^2).  GENERAL: vitals reviewed and  listed above, alert, oriented, appears well hydrated and in no acute distress HEENT: atraumatic, conjunctiva  clear, no obvious abnormalities on inspection of external nose and ears OP : no lesion edema or exudate  NECK: no obvious masses on inspection palpation  LUNGS: clear to auscultation bilaterally, no wheezes, rales or rhonchi, good air movement CV: HRRR, no clubbing cyanosis or  peripheral edema nl cap refill  Abdomen:  Sof,t normal bowel sounds without hepatosplenomegaly, no guarding rebound or masses no CVA tenderness MS: moves all extremities without noticeable focal  abnormality PSYCH: pleasant and cooperative,   ASSESSMENT AND PLAN:  Discussed the following assessment and plan:  Gastroenteritis/enteritis - prob infectious colitis based ion context and  resolyution of sx and no hx of same clsoe observation plan furhter wu  if any sx recur.  Colitis - better  Elevated white blood cell count - acute phase during illness  recheck now well.  - Plan: CBC with Differential/Platelet, Sedimentation rate, Lipid panel  -Patient advised to return or notify health care team  if symptoms worsen ,persist or new concerns arise.  Patient Instructions   This may be just a bad bowel infection and may never come back  However   If any recurrence we need to have you see GI specialist. Repeating lab today to make sure down to normal also. Recheck at any   Gi concerns .   Wt Readings from Last 3 Encounters:  09/27/14 132 lb (59.875 kg)  07/27/14 131 lb 6.4 oz (59.603 kg)  11/11/13 130 lb (58.968 kg) (11 %*, Z = -1.21)   * Growth percentiles are based on CDC 2-20 Years data.        Neta MendsWanda K. Parrie Rasco M.D.  Gastroenteritis  Colitis   19 year old male presents with nausea and vomiting since 1:00 AM this morning. No new exposures and no sick contacts. Denies diarrhea. Did have epigastric abdominal pain with vomiting that has improved at this point. He has no abdominal tenderness on my exam throughout. At this point, we'll defer imaging as this appears to be a gastritis. Lack of abdominal tenderness or vital sign abnormality makes acute abdominal process much less likely. We'll do serial abdominal exams throughout emergency department stay. Afebrile and vital signs stable. Fluid hydration, Zofran, basic labs.  5:46 PM repeat abdominal exam remains benign to deep palpation throughout. Leukocytosis likely stress reactive in nature as he is also hyperglycemic. Symptomatically much improved with fluids and Zofran. Vital signs remain stable. No further diaphoresis and nausea is improved. Given possible unreliability of history given autism and report of right lower quadrant pain from grandmother, CT abdomen and pelvis ordered.  9:00 PM CT reflective of colitis. On repeat exam, patient tolerating by mouth and much improved. Likely  viral, but cannot rule out bacterial and we'll treat with Cipro Flagyl in addition to Zofran and oral rehydration. PCP follow-up.   Dorna LeitzAlex Nickle, MD 09/13/14 2108  Blake DivineJohn Wofford, MD 09/13/14 2335       IMPRESSION: 1. Mild colonic wall thickening of the ascending colon consistent with colitis. This may be infectious or inflammatory. 2. Physiologically distended gallbladder with mild gallbladder wall thickening. This may be reactive related to the colonic process, however right upper quadrant ultrasound could be considered if there is clinical concern for hepatobiliary pathology.   Electronically Signed By: Rubye OaksMelanie Ehinger M.D. On: 09/13/2014 20:15

## 2014-12-20 ENCOUNTER — Other Ambulatory Visit: Payer: Self-pay | Admitting: Family

## 2014-12-20 DIAGNOSIS — F909 Attention-deficit hyperactivity disorder, unspecified type: Secondary | ICD-10-CM

## 2014-12-20 MED ORDER — AMPHETAMINE-DEXTROAMPHETAMINE 20 MG PO TABS: ORAL_TABLET | ORAL | Status: DC

## 2014-12-20 NOTE — Telephone Encounter (Signed)
Mom Malachi BondsGloria Gleason asked for Rx to be mailed. I put Rx in the mail as requested. TG

## 2015-03-15 ENCOUNTER — Telehealth: Payer: Self-pay

## 2015-03-15 DIAGNOSIS — F909 Attention-deficit hyperactivity disorder, unspecified type: Secondary | ICD-10-CM

## 2015-03-15 MED ORDER — AMPHETAMINE-DEXTROAMPHETAMINE 20 MG PO TABS: ORAL_TABLET | ORAL | Status: DC

## 2015-03-15 NOTE — Telephone Encounter (Signed)
[name], mom, lvm requesting Rx for patient's generic Adderall 20 mg. She asked that it be mailed to her home. I called and let her know that we will mail the Rx when it is available.

## 2015-03-15 NOTE — Telephone Encounter (Signed)
Mailed as requested.

## 2015-05-16 ENCOUNTER — Telehealth: Payer: Self-pay

## 2015-05-16 DIAGNOSIS — F909 Attention-deficit hyperactivity disorder, unspecified type: Secondary | ICD-10-CM

## 2015-05-16 MED ORDER — AMPHETAMINE-DEXTROAMPHETAMINE 20 MG PO TABS: ORAL_TABLET | ORAL | Status: DC

## 2015-05-16 NOTE — Telephone Encounter (Signed)
Mailed as requested.

## 2015-05-16 NOTE — Telephone Encounter (Signed)
Ronald Rivera lvm requesting Rx for child's Generic Adderall 20 mg to be mailed to their home. CB [phone]. I called and let her know we will mail as requested.

## 2015-07-28 ENCOUNTER — Ambulatory Visit: Payer: 59 | Admitting: Pediatrics

## 2015-08-02 ENCOUNTER — Ambulatory Visit (INDEPENDENT_AMBULATORY_CARE_PROVIDER_SITE_OTHER): Payer: 59 | Admitting: Pediatrics

## 2015-08-02 ENCOUNTER — Encounter: Payer: Self-pay | Admitting: Pediatrics

## 2015-08-02 VITALS — BP 110/82 | HR 100 | Ht 68.0 in | Wt 123.8 lb

## 2015-08-02 DIAGNOSIS — G2569 Other tics of organic origin: Secondary | ICD-10-CM

## 2015-08-02 DIAGNOSIS — F902 Attention-deficit hyperactivity disorder, combined type: Secondary | ICD-10-CM

## 2015-08-02 DIAGNOSIS — F840 Autistic disorder: Secondary | ICD-10-CM | POA: Insufficient documentation

## 2015-08-02 MED ORDER — CLONIDINE HCL 0.1 MG PO TABS: ORAL_TABLET | ORAL | Status: DC

## 2015-08-02 MED ORDER — AMPHETAMINE-DEXTROAMPHETAMINE 20 MG PO TABS: ORAL_TABLET | ORAL | Status: DC

## 2015-08-02 NOTE — Progress Notes (Signed)
Patient: Ronald Rivera MRN: [id number] Sex: male DOB: 13-Mar-1996  Provider: Deetta Perla, MD Location of Care: [facility]  Note type: Routine return visit  History of Present Illness: Referral Source: Nona Dell, MD History from: grandmother, patient and [facility] chart Chief Complaint: Tics/Autsim/ADHD  Ronald Rivera is a 20 y.o. male who was evaluated August 02, 2015 for the first time since July 27, 2014.  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  He graduated from high school in June 2015.  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    Recently, he was working at a [employer] during the Christmas holiday.  He was praised by the supervising staff for his work.  During the holiday there was a large snow fall and he came in when the weather was quite bad.  He was let go without any explanation.  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  He has difficulty making change. The [employer] [place] has been helping him go to job interviews, job coaching, and providing feedback to him.  The action of the people at [employer] is extremely disappointing not only because he lost a job, but they fired him abruptly without obvious cause.  He is living at home.  He is going out for other job interviews; one of them is at [employer].  He continues to take generic Adderall and clonidine did not help his attention span and his motor tics.  His mother does not necessarily give him the prescribed dose every day.  His health is good, he is sleeping well.  He lost 8 pounds since he was last seen for reasons that are unclear.  No other concerns were raised today.  Review of Systems: 12 system review was unremarkable  Past Medical History Diagnosis Date  . ADHD (attention deficit hyperactivity disorder)   .  Autistic spectrum disorder     sees Dr Sharene Skeans  . Tics of organic origin     hx of  . STREPTOCOCCAL PHARYNGITIS 04/03/2010    Qualifier: Diagnosis of  By: Fabian Sharp MD, Neta Mends   . Hx of varicella   . Left acute otitis media 03/12/2012   Hospitalizations: No., Head Injury: No., Nervous System Infections: No., Immunizations up to date: Yes.    Behavior History autism spectrum disorder  Surgical History History reviewed. No pertinent past surgical history.  Family History family history includes Cancer in his paternal grandmother. Family history is negative for migraines, seizures, intellectual disabilities, blindness, deafness, birth defects, chromosomal disorder, or autism.  Social History . Marital Status: Single    Spouse Name: N/A  . Number of Children: N/A  . Years of Education: N/A   Social History Main Topics  . Smoking status: Never Smoker   . Smokeless tobacco: Never Used  . Alcohol Use: No  . Drug Use: No  . 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 He lives with his grandparents. He enjoys playing the Xbox and working.    Allergies Allergen Reactions  . Aripiprazole Other (See Comments)    Causes seizures   Physical Exam BP 110/82 mmHg  Pulse 100  Ht  (1.727 m)  Wt 123 lb 12.8 oz (56.155 kg)  BMI  18.83 kg/m2  General: alert, well developed, well nourished, blond hair, blue eyes, right-handed, in no acute distress  Head: normocephalic, no dysmorphic features  Ears, Nose and Throat: Otoscopic: tympanic membranes normal . Pharynx: oropharynx is pink without exudates or tonsillar hypertrophy.  Neck: supple, full range of motion, no cranial or cervical bruits  Respiratory: auscultation clear  Cardiovascular: no murmurs, pulses are normal  Musculoskeletal: no skeletal deformities or apparent scoliosis  Skin: no rashes or  neurocutaneous lesions   Neurologic Exam  Mental Status: alert; oriented to person, place; knowledge is below normal for age; language is somewhat concrete. He has a flat affect but was very pleasant and cooperative. Eye contact was intermittent  Cranial Nerves: visual fields are full to double simultaneous stimuli; extraocular movements are full and conjugate; pupils are round reactive to light; funduscopic examination shows sharp disc margins with normal vessels; symmetric facial strength; midline tongue and uvula; hearing is intact and symmetric  Motor: Normal strength, tone, and mass; good fine motor movements; no pronator drift.  Sensory: intact responses to touch and temperature  Coordination: good finger-to-nose, rapid repetitive alternating movements and finger apposition  Gait and Station: normal gait and station; patient is able to walk on heels, toes and tandem without difficulty; balance is adequate; Romberg exam is negative; Gower response is negative  Reflexes: symmetric and diminished bilaterally; no clonus; bilateral flexor plantar responses   Assessment 1. Autism spectrum disorder requiring support (level 1), F84.0. 2. Attention deficit hyperactivity disorder, combined type, F90.2. 3. Tics of organic origin, G25.69.  Discussion Ronald Rivera is doing well.  I am very unhappy about the way he was treated by [employer].  In young adults with autism it takes the right job opportunity and the right supervisors in order to make a successful work environment and gravity has held with the autism society in [place].  I had several young adults with autism spectrum disorder who are gainfully employed and have been for years.  They are little study employees and do well in their jobs.  Plan I refilled prescription for Adderall and clonidine.  I asked Ronald Rivera to return to see me in a year.  I spent 30 minutes of face-to-face time with Ronald Rivera and his mother.  I will be happy to see him  sooner based on clinical need.   Medication List   This list is accurate as of: 08/02/15 11:59 PM.       acetaminophen 500 MG tablet  Commonly known as:  TYLENOL  Take 1,000 mg by mouth every 6 (six) hours as needed for headache.     amphetamine-dextroamphetamine 20 MG tablet  Commonly known as:  ADDERALL  Take 1 1/2 tabs po qam &  1/2 tab po prn at 4PM     cloNIDine 0.1 MG tablet  Commonly known as:  CATAPRES  Take one half tablet twice daily      The medication list was reviewed and reconciled. All changes or newly prescribed medications were explained.  A complete medication list was provided to the patient/caregiver.  Deetta Perla MD

## 2015-08-22 ENCOUNTER — Other Ambulatory Visit: Payer: Self-pay | Admitting: Family

## 2015-10-02 ENCOUNTER — Telehealth: Payer: Self-pay

## 2015-10-02 DIAGNOSIS — F902 Attention-deficit hyperactivity disorder, combined type: Secondary | ICD-10-CM

## 2015-10-02 MED ORDER — AMPHETAMINE-DEXTROAMPHETAMINE 20 MG PO TABS: ORAL_TABLET | ORAL | Status: DC

## 2015-10-02 NOTE — Telephone Encounter (Signed)
Placed Rx in the mail as requested. 

## 2015-10-02 NOTE — Telephone Encounter (Signed)
Ronald Rivera, mom, lvm requesting prescription for child's generic Adderall 20 mg to be mailed to their home. She confirmed address.  I lvm for mom letting her know that I received the refill request and will mail as requested.

## 2015-12-06 ENCOUNTER — Telehealth: Payer: Self-pay

## 2015-12-06 DIAGNOSIS — F902 Attention-deficit hyperactivity disorder, combined type: Secondary | ICD-10-CM

## 2015-12-06 MED ORDER — AMPHETAMINE-DEXTROAMPHETAMINE 20 MG PO TABS: ORAL_TABLET | ORAL | Status: DC

## 2015-12-06 NOTE — Telephone Encounter (Signed)
[name] lvm requesting Rx for patient's amphetamine-dextroamphetamine (ADDERALL) 20 MG tablet. She asked that Rx be mailed to their home.  Lvm letting her know we will place in the mail as requested.

## 2015-12-06 NOTE — Telephone Encounter (Signed)
Placed Rx in the mail as requested. 

## 2016-02-07 ENCOUNTER — Telehealth: Payer: Self-pay

## 2016-02-07 DIAGNOSIS — F902 Attention-deficit hyperactivity disorder, combined type: Secondary | ICD-10-CM

## 2016-02-07 MED ORDER — AMPHETAMINE-DEXTROAMPHETAMINE 20 MG PO TABS: ORAL_TABLET | ORAL | 0 refills | Status: DC

## 2016-02-07 NOTE — Telephone Encounter (Signed)
Placed Rx in the mail as requested.

## 2016-02-07 NOTE — Telephone Encounter (Signed)
[name] lvm requsting RX for pt's Amphetamine-dextroamphetamine 20 mg to be mailed to their home. [phone].

## 2016-02-09 NOTE — Telephone Encounter (Signed)
[name] lvm asking if the Rx request was received. I lvm apologizing for not calling her back sooner, reassured her  that I placed the Rx in the mail on 02/07/16.

## 2016-04-02 ENCOUNTER — Telehealth (INDEPENDENT_AMBULATORY_CARE_PROVIDER_SITE_OTHER): Payer: Self-pay

## 2016-04-02 DIAGNOSIS — F902 Attention-deficit hyperactivity disorder, combined type: Secondary | ICD-10-CM

## 2016-04-02 MED ORDER — AMPHETAMINE-DEXTROAMPHETAMINE 20 MG PO TABS: ORAL_TABLET | ORAL | 0 refills | Status: DC

## 2016-04-02 NOTE — Telephone Encounter (Signed)
Patient's mother called requesting a refill on Adderall 20 MG. She would like for it to be mailed home.   CB:[phone]

## 2016-04-02 NOTE — Telephone Encounter (Signed)
Rx has been put upfront to be mailed

## 2016-04-18 IMAGING — CT CT ABD-PELV W/ CM
2 of 4 series · 16 of 46 positions shown, 18 images · IV contrast (Omni 300)
Comparison: None.

CLINICAL DATA: Diffuse abdominal pain, with nausea and vomiting for
1 day.

EXAM:
CT ABDOMEN AND PELVIS WITH CONTRAST
TECHNIQUE: Multidetector CT imaging of the abdomen and pelvis was performed
using the standard protocol following bolus administration of
intravenous contrast.
CONTRAST:  100mL OMNIPAQUE IOHEXOL 300 MG/ML  SOLN

[Series 2: abd/ pelvis 5.0 i30f 1 · axial · 0.68mm/px · z∈[-577,-152]mm · 13 of 93 slices shown, 15 images]
[im 4/93  soft-tissue]
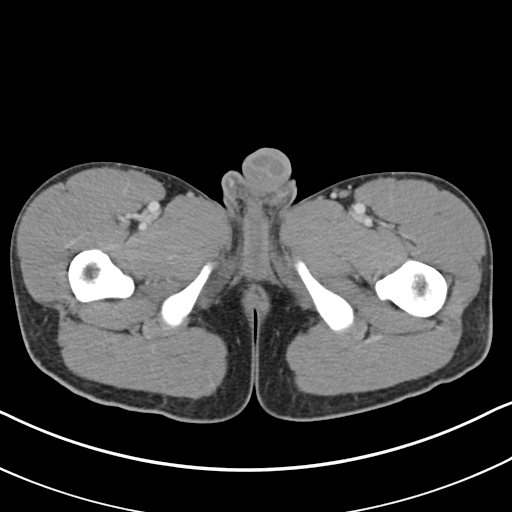
[im 4/93  bone]
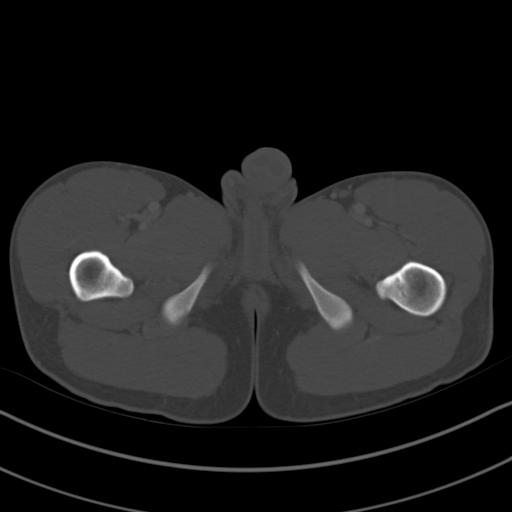
[im 12/93  soft-tissue]
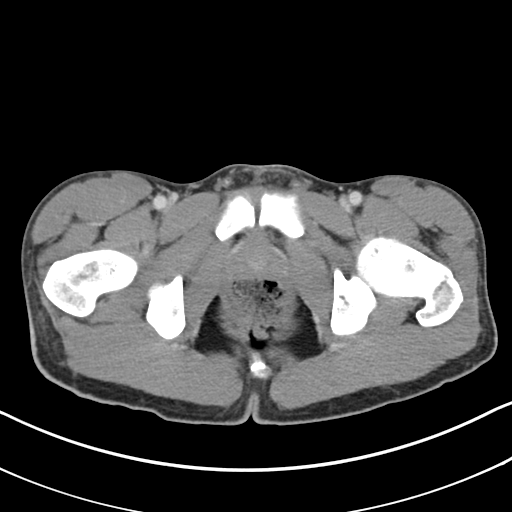
[im 20/93  soft-tissue]
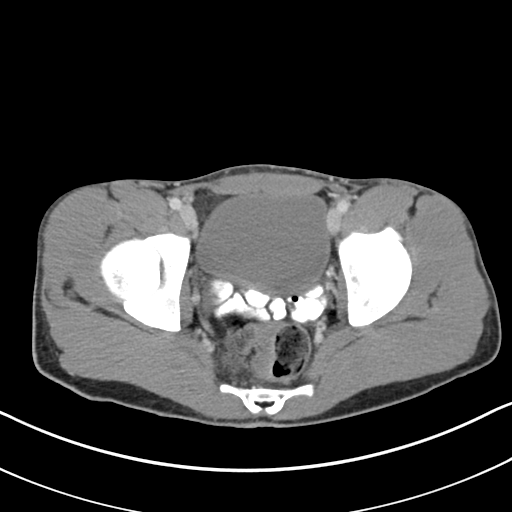
[im 27/93  soft-tissue]
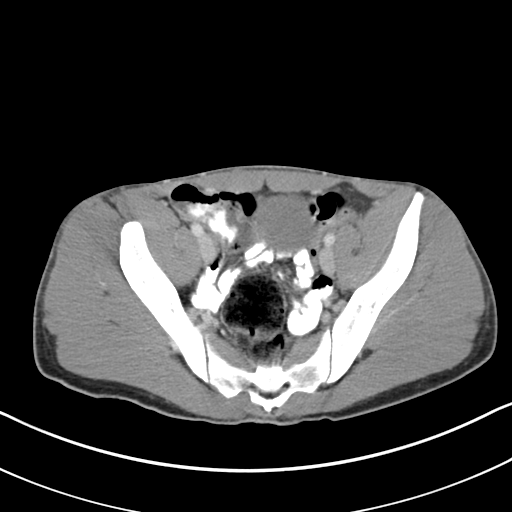
[im 31/93  soft-tissue]
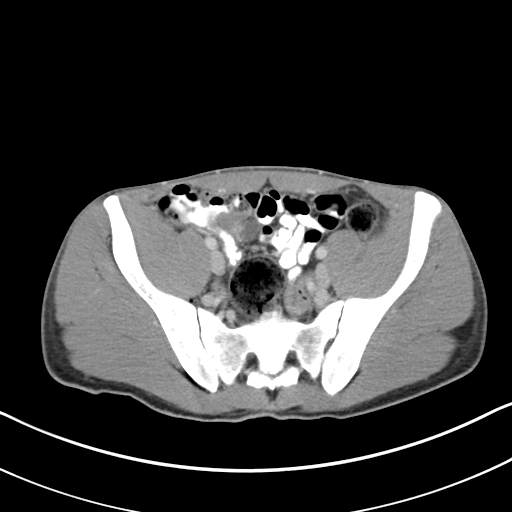
[im 39/93  soft-tissue]
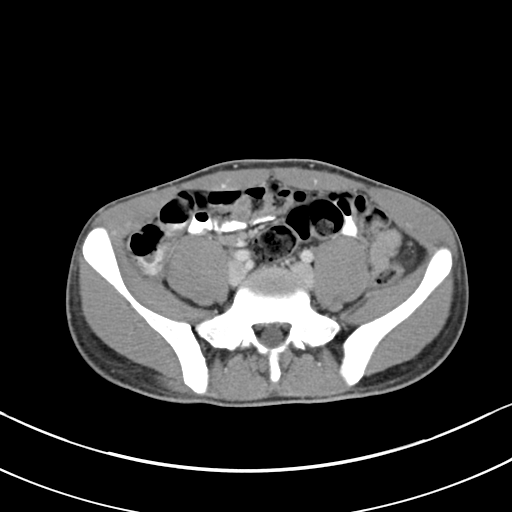
[im 47/93  soft-tissue]
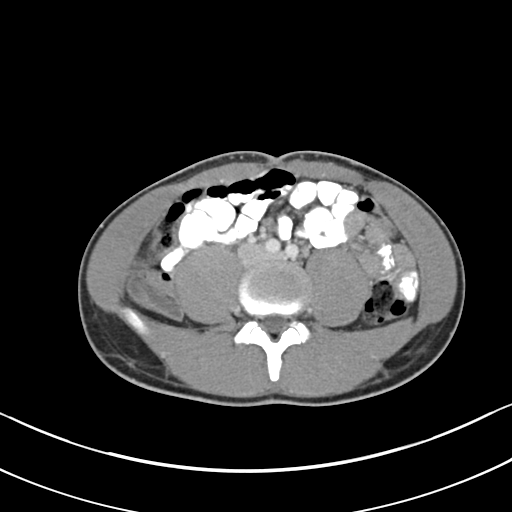
[im 54/93  soft-tissue]
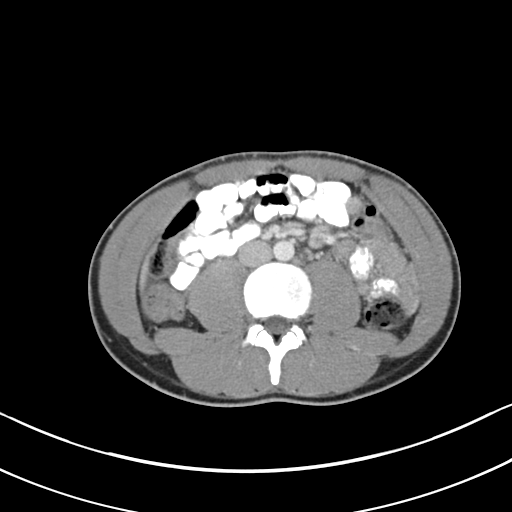
[im 62/93  soft-tissue]
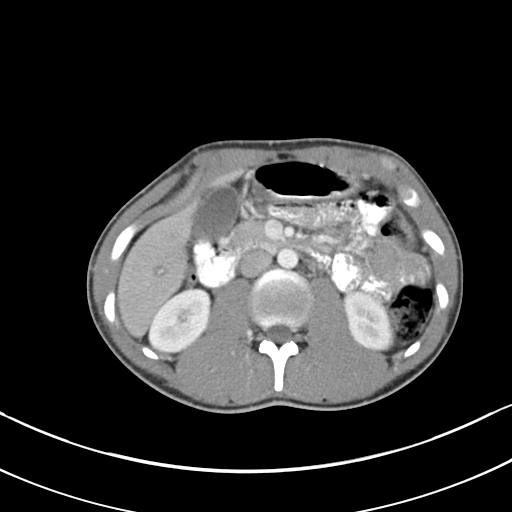
[im 62/93  bone]
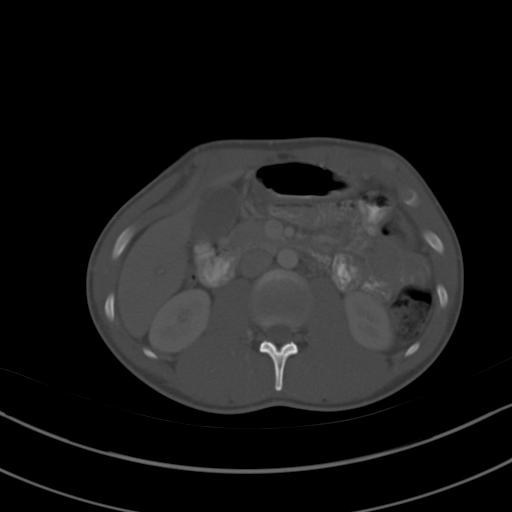
[im 66/93  soft-tissue]
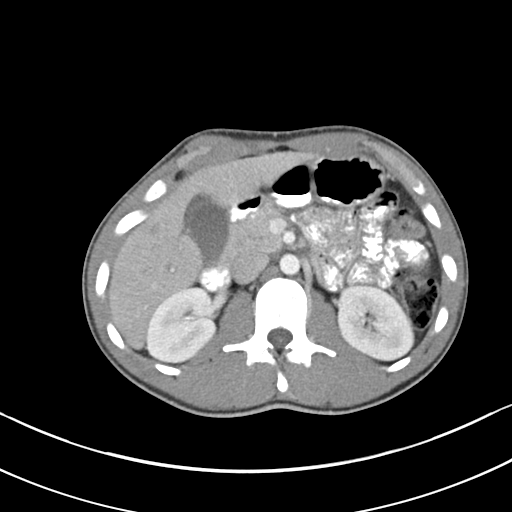
[im 73/93  soft-tissue]
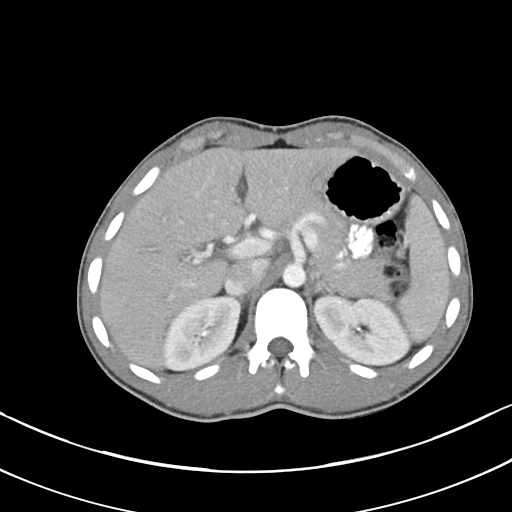
[im 81/93  soft-tissue]
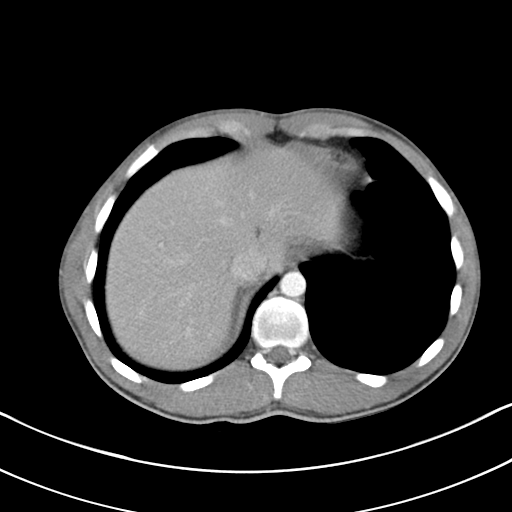
[im 89/93  soft-tissue]
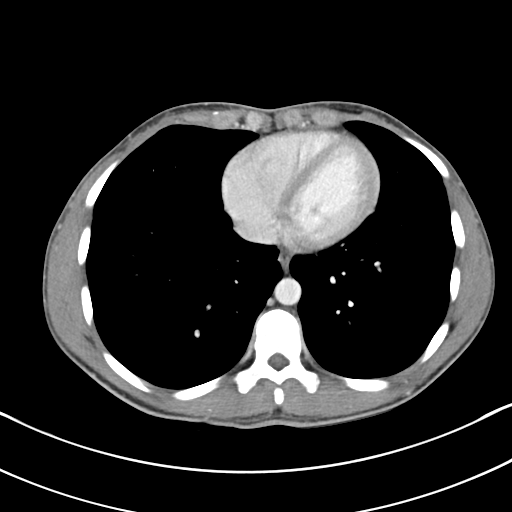

[Series 4: coronals · coronal · 0.70mm/px · 3 of 102 slices shown]
[im 34/102  soft-tissue]
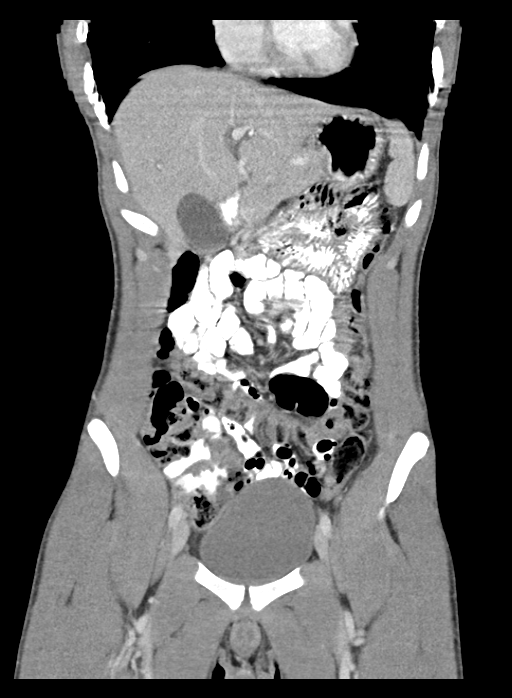
[im 45/102  soft-tissue]
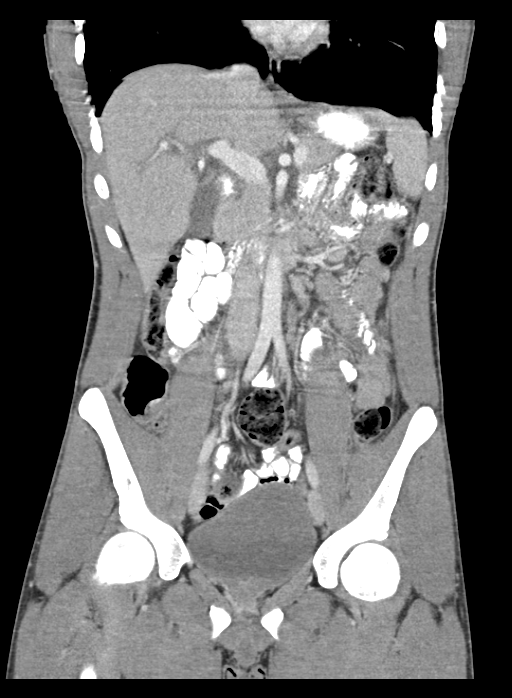
[im 57/102  soft-tissue]
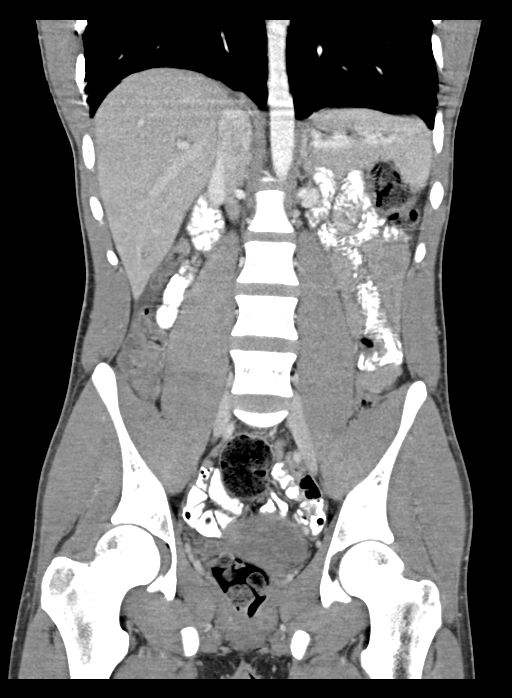

[16 of 46 positions shown; findings below may reference images not displayed]

FINDINGS: The included lung bases are clear.

The gallbladder is physiologically distended, there is probable
gallbladder wall thickening measuring up to 5.5 mm. No definite
calcified gallstone. The common bile duct appears normal in caliber,
there is mild periportal edema. No focal hepatic lesion. The spleen,
pancreas, and adrenal glands are normal. The kidneys are symmetric
in size with symmetric enhancement. No hydronephrosis or localizing
renal abnormality.

Stomach is physiologically distended with contrast. There are no
dilated or thickened bowel loops. The appendix is normal, best
appreciated on coronal reformat. There is mild colonic wall
thickening involving the ascending colon. Small volume of stool
throughout the remainder the colon. No free air, free fluid, or
intra-abdominal fluid collection.

Abdominal aorta is normal in caliber. No retroperitoneal adenopathy.

Within the pelvis the urinary bladder is physiologically distended.
There is no pelvic free fluid. No pelvic adenopathy.

There are no acute or suspicious osseous abnormalities.
IMPRESSION: 1. Mild colonic wall thickening of the ascending colon consistent
with colitis. This may be infectious or inflammatory.
2. Physiologically distended gallbladder with mild gallbladder wall
thickening. This may be reactive related to the colonic process,
however right upper quadrant ultrasound could be considered if there
is clinical concern for hepatobiliary pathology.

## 2016-05-18 ENCOUNTER — Other Ambulatory Visit: Payer: Self-pay | Admitting: Family

## 2016-05-21 ENCOUNTER — Telehealth (INDEPENDENT_AMBULATORY_CARE_PROVIDER_SITE_OTHER): Payer: Self-pay | Admitting: Pediatrics

## 2016-05-21 NOTE — Telephone Encounter (Signed)
-----   Message from Elveria Risingina Goodpasture, NP sent at 05/20/2016  8:38 AM EST ----- Regarding: Needs appointment Chrissie NoaWilliam needs an appointment with Dr Sharene SkeansHickling or his resident.  Thanks,  Inetta Fermoina

## 2016-05-21 NOTE — Telephone Encounter (Signed)
-----   Message from Tina Goodpasture, NP sent at 05/20/2016  8:38 AM EST ----- °Regarding: Needs appointment °Dois needs an appointment with Dr Hickling or his resident.  °Thanks,  °Tina °

## 2016-05-21 NOTE — Telephone Encounter (Signed)
LVM to CB to schedule 6 mo fu appt °

## 2016-06-05 ENCOUNTER — Telehealth (INDEPENDENT_AMBULATORY_CARE_PROVIDER_SITE_OTHER): Payer: Self-pay

## 2016-06-05 ENCOUNTER — Other Ambulatory Visit: Payer: Self-pay | Admitting: Family

## 2016-06-05 DIAGNOSIS — F902 Attention-deficit hyperactivity disorder, combined type: Secondary | ICD-10-CM

## 2016-06-05 MED ORDER — AMPHETAMINE-DEXTROAMPHETAMINE 20 MG PO TABS: ORAL_TABLET | ORAL | 0 refills | Status: DC

## 2016-06-05 NOTE — Telephone Encounter (Signed)
Patient's mother, Malachi BondsGloria called for a refill on Adderall 20 mg   [phone]

## 2016-06-06 NOTE — Telephone Encounter (Signed)
Informed mom that the rx was ready for pick up. She requested that it be put in the mail.

## 2016-06-24 ENCOUNTER — Encounter: Payer: Self-pay | Admitting: Internal Medicine

## 2016-06-24 ENCOUNTER — Ambulatory Visit (INDEPENDENT_AMBULATORY_CARE_PROVIDER_SITE_OTHER): Payer: Commercial Managed Care - HMO | Admitting: Internal Medicine

## 2016-06-24 ENCOUNTER — Encounter: Payer: Self-pay | Admitting: Family Medicine

## 2016-06-24 VITALS — BP 120/70 | Temp 98.5°F | Wt 130.4 lb

## 2016-06-24 DIAGNOSIS — B9789 Other viral agents as the cause of diseases classified elsewhere: Secondary | ICD-10-CM

## 2016-06-24 DIAGNOSIS — J069 Acute upper respiratory infection, unspecified: Secondary | ICD-10-CM | POA: Diagnosis not present

## 2016-06-24 DIAGNOSIS — R059 Cough, unspecified: Secondary | ICD-10-CM

## 2016-06-24 DIAGNOSIS — R05 Cough: Secondary | ICD-10-CM | POA: Diagnosis not present

## 2016-06-24 NOTE — Progress Notes (Signed)
Pre visit review using our clinic review tool, if applicable. No additional management support is needed unless otherwise documented below in the visit note.  Chief Complaint  Patient presents with  . Cough    [id number]  . Nasal Congestion    HPI: Ronald Rivera 20 y.o.  sda  comesin with brother who alos has a coughing illness and GM Number of members of the family have had illnesses onset with low-grade fever URI and then cough. Sibling has been coughing for 10 days. He has had upper respiratory congestion and now cough low-grade fever at onset. No shortness of breath symptomatic treatment Robitussin DM. Here for evaluation to make sure no other intervention needed. Works at [employer] 4 hours in the outside has been bitterly dangerously cold recently to [employer]. Needs note for work that he missed this past week.  ROS: See pertinent positives and negatives per HPI.  Past Medical History:  Diagnosis Date  . ADHD (attention deficit hyperactivity disorder)   . Autistic spectrum disorder    sees Dr Sharene Skeans  . Hx of varicella   . Left acute otitis media 03/12/2012  . STREPTOCOCCAL PHARYNGITIS 04/03/2010   Qualifier: Diagnosis of  By: Fabian Sharp MD, Neta Mends   . Tics of organic origin    hx of    Family History  Problem Relation Age of Onset  . Cancer Paternal Grandmother     Died in her 71's    Social History   Social History  . Marital status: Single    Spouse name: N/A  . Number of children: N/A  . Years of education: N/A   Social History Main Topics  . Smoking status: Never Smoker  . Smokeless tobacco: Never Used  . Alcohol use No  . Drug use: No  . Sexual activity: No   Other Topics Concern  . 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 He lives with his grandparents. He enjoys playing the Xbox and working.     Outpatient Medications Prior to Visit    Medication Sig Dispense Refill  . acetaminophen (TYLENOL) 500 MG tablet Take 1,000 mg by mouth every 6 (six) hours as needed for headache.    . amphetamine-dextroamphetamine (ADDERALL) 20 MG tablet Take 1 1/2 tabs po qam &  1/2 tab po prn at 4PM 60 tablet 0  . cloNIDine (CATAPRES) 0.1 MG tablet TAKE 1/2 TABLET BY MOUTH IN THE MORNING AND 1/2 TABLET BY MOUTH IN THE EVENING 30 tablet 2   No facility-administered medications prior to visit.      EXAM:  BP 120/70 (BP Location: Right Arm, Patient Position: Sitting, Cuff Size: Normal)   Temp 98.5 F (36.9 C) (Oral)   Wt 130 lb 6.4 oz (59.1 kg)   BMI 19.83 kg/m   Body mass index is 19.83 kg/m.  GENERAL: vitals reviewed and listed above, alert, oriented, appears well hydrated and in no acute distress looks mildly ill nontoxic congested occasional loose cough. HEENT: atraumatic, conjunctiva  clear, no obvious abnormalities on inspection of external nose and ears TMs normal landmarks nares minimal congestion face nontender OP : no lesion edema or exudate  NECK: no obvious masses on inspection palpation  LUNGS: clear to auscultation bilaterally, no wheezes, rales or rhonchi, good air movement CV: HRRR, no clubbing cyanosis or  peripheral edema nl cap refill  MS: moves all extremities without noticeable focal  abnormality Looks hydrated normal capillary refill.  ASSESSMENT AND PLAN:  Discussed the following assessment and plan:  Cough  Viral upper respiratory tract infection with cough At this point I do not see complications are benefit from antibiotic. Alarm symptoms to findings get back with us if fever relapsing symptoms. Note for work can return on January 10 unless feeling better earlier discussed this with grandmother and patient. Discussed symptomatic treatment in the short run. Expectant management. -Patient advised to return or notify health care team  if symptoms worsen ,persist or new concerns arise.  Patient Instructions   Patient to use symptomatic treatment as discussed with grandmother and sibling. Note for work /can return on January 10 unless feeling better follow-up of relapsing symptoms fever etc.    Neta MendsWanda K. Carla Whilden M.D.

## 2016-06-24 NOTE — Patient Instructions (Addendum)
Patient to use symptomatic treatment as discussed with grandmother and sibling. Note for work /can return on January 10 unless feeling better follow-up of relapsing symptoms fever etc.

## 2016-08-01 ENCOUNTER — Encounter (INDEPENDENT_AMBULATORY_CARE_PROVIDER_SITE_OTHER): Payer: Self-pay | Admitting: Pediatrics

## 2016-08-01 ENCOUNTER — Ambulatory Visit (INDEPENDENT_AMBULATORY_CARE_PROVIDER_SITE_OTHER): Payer: Commercial Managed Care - HMO | Admitting: Pediatrics

## 2016-08-01 VITALS — BP 100/70 | HR 120 | Ht 68.0 in | Wt 126.8 lb

## 2016-08-01 DIAGNOSIS — F902 Attention-deficit hyperactivity disorder, combined type: Secondary | ICD-10-CM

## 2016-08-01 DIAGNOSIS — F840 Autistic disorder: Secondary | ICD-10-CM | POA: Diagnosis not present

## 2016-08-01 MED ORDER — CLONIDINE HCL 0.1 MG PO TABS: ORAL_TABLET | ORAL | 5 refills | Status: DC

## 2016-08-01 MED ORDER — AMPHETAMINE-DEXTROAMPHETAMINE 20 MG PO TABS: ORAL_TABLET | ORAL | 0 refills | Status: DC

## 2016-08-01 NOTE — Progress Notes (Signed)
Patient: Ronald EvaWilliam Ronald Rivera MRN: [id number] Sex: male DOB: 11/20/1995  Provider: Ellison CarwinWilliam Robinette Esters, MD Location of Care: [facility] [facility]  Note type: Routine return visit  History of Present Illness: Referral Source: Nona DellWanda Posh, MD History from: grandmother, patient and [facility] chart Chief Complaint: Tics/Autism/ADHD  Ronald EvaWilliam Ronald Lukehart is a 21 y.o. male who returns on August 01, 2016 for the first time since August 02, 2015.  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  He graduated from high school in June 2015.  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  He has been a very reliable employee and seems to derive a fair amount of satisfaction from his efforts and the praise of his supervisors.  He is employed 25 to 30 hours per week.  His mother feels that he does not need as much medication as he used to take when he was in school and I agree.  Basically, she has dropped his generic Adderall to one tablet per day that is given just before he goes off to work.  Sometimes this can be as late as 3 o'clock, which may delay his bed hour a little bit, but not much.  She also was giving clonidine one-half tablet a day  coincident with the Adderall.  I told her that if she wants to skip the Adderall on weekends that she can do that, but she cannot do that with clonidine.  If he has to get up at 5:30 AM for the morning shift, he is in bed by 10:30.  If he can sleep in, he often does not go to bed until midnight or 1 AM and sleeps past 8.  He enjoys playing video games.  Ronald's health has been good.  No other concerns were raised today.  He presented because he needed prescription refills and this was his yearly reassessment.  Review of Systems: 12 system review was assessed and was negative  Past Medical History Diagnosis Date  . ADHD (attention  deficit hyperactivity disorder)   . Autistic spectrum disorder    sees Dr Sharene SkeansHickling  . Hx of varicella   . Left acute otitis media 03/12/2012  . STREPTOCOCCAL PHARYNGITIS 04/03/2010   Qualifier: Diagnosis of  By: Fabian SharpPanosh MD, Neta MendsWanda K   . Tics of organic origin    hx of   Hospitalizations: No., Head Injury: No., Nervous System Infections: No., Immunizations up to date: Yes.    Behavior History autism spectrum disorder  Surgical History History reviewed. No pertinent surgical history.  Family History family history includes Cancer in his paternal grandmother. Family history is negative for migraines, seizures, intellectual disabilities, blindness, deafness, birth defects, chromosomal disorder, or autism.  Social History . Marital status: Single    Spouse name: N/A  . Number of children: N/A  . Years of education: N/A   Social History Main Topics  . Smoking status: Never Smoker  . Smokeless tobacco: Never Used  . Alcohol use No  . Drug use: No  . 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    He is currently employed with [employer].    He lives with his grandparents.     He enjoys playing the Xbox and working.    Allergies Allergen Reactions  . Aripiprazole Other (See Comments)    Causes seizures  Physical Exam BP 100/70   Pulse (!) 120   Ht 5\' 8"  (1.727 m)   Wt 126 lb 12.8 oz (57.5 kg)   BMI 19.28 kg/m   General: alert, well developed, well nourished, in no acute distress, blond hair, blue eyes, right handed Head: normocephalic, no dysmorphic features Ears, Nose and Throat: Otoscopic: tympanic membranes normal; pharynx: oropharynx is pink without exudates or tonsillar hypertrophy Neck: supple, full range of motion, no cranial or cervical bruits Respiratory: auscultation clear Cardiovascular: no murmurs, pulses are normal Musculoskeletal: no skeletal deformities or apparent scoliosis Skin: no rashes or  neurocutaneous lesions  Neurologic Exam  Mental Status: alert; oriented to person; knowledge is below normal for age; language is concrete, eye contact is intermittent, he was cooperative and pleasant Cranial Nerves: visual fields are full to double simultaneous stimuli; extraocular movements are full and conjugate; pupils are round reactive to light; funduscopic examination shows sharp disc margins with normal vessels; symmetric facial strength; midline tongue and uvula; air conduction is greater than bone conduction bilaterally Motor: Normal strength, tone and mass; good fine motor movements; no pronator drift Sensory: intact responses to cold, vibration, proprioception and stereognosis Coordination: good finger-to-nose, rapid repetitive alternating movements and finger apposition Gait and Station: normal gait and station: patient is able to walk on heels, toes and tandem without difficulty; balance is adequate; Romberg exam is negative; Gower response is negative Reflexes: symmetric and diminished bilaterally; no clonus; bilateral flexor plantar responses  Assessment 1. Autism spectrum disorder requiring support (level 1), F84.0. 2. Attention deficit hyperactivity disorder, combined type, F90.2.  Discussion I am very pleased that [name] has gainful employment and that he is happy with what he does.  I agree with his mother that Adderall is only needed during that time he needs to do his job and fortunately even if he has to work later, it does not keep him up so late that it is problematic.  There are concerns about rebound hypertension when clonidine is abruptly stopped so that is the reason I asked his mother to be consistent with administrating clonidine even though it is in low dose.  Plan I refilled prescriptions for generic Adderall and for clonidine.  He will return to see me in one year's time.  I will see him sooner based on clinical need.  I asked his mother to contact me by  MyChart if there were concerns about [name].   Medication List   Accurate as of 08/01/16 11:32 AM.      acetaminophen 500 MG tablet Commonly known as:  TYLENOL Take 1,000 mg by mouth every 6 (six) hours as needed for headache.   amphetamine-dextroamphetamine 20 MG tablet Commonly known as:  ADDERALL Take 1 1/2 tabs po qam &  1/2 tab po prn at 4PM   cloNIDine 0.1 MG tablet Commonly known as:  CATAPRES TAKE 1/2 TABLET BY MOUTH IN THE MORNING AND 1/2 TABLET BY MOUTH IN THE EVENING    The medication list was reviewed and reconciled. All changes or newly prescribed medications were explained.  A complete medication list was provided to the patient/caregiver.  Deetta Perla MD

## 2016-08-01 NOTE — Patient Instructions (Signed)
I'm pleased that you doing so well and very happy that you are employed.  Please sign up for My Chart so we can communicate concerning her medications, any changes we need to make, appointments, or other concerns.

## 2016-09-24 ENCOUNTER — Telehealth (INDEPENDENT_AMBULATORY_CARE_PROVIDER_SITE_OTHER): Payer: Self-pay

## 2016-09-24 DIAGNOSIS — F902 Attention-deficit hyperactivity disorder, combined type: Secondary | ICD-10-CM

## 2016-09-24 MED ORDER — AMPHETAMINE-DEXTROAMPHETAMINE 20 MG PO TABS: ORAL_TABLET | ORAL | 0 refills | Status: DC

## 2016-09-24 NOTE — Telephone Encounter (Signed)
Ronald Rivera called for a refill on Adderall . She would like the rx mailed to her home address.   CB:[phone]

## 2016-09-24 NOTE — Telephone Encounter (Signed)
Rx has been placed upfront to be mailed to the home address 

## 2016-11-01 ENCOUNTER — Other Ambulatory Visit (INDEPENDENT_AMBULATORY_CARE_PROVIDER_SITE_OTHER): Payer: Self-pay | Admitting: Pediatrics

## 2016-11-01 DIAGNOSIS — F902 Attention-deficit hyperactivity disorder, combined type: Secondary | ICD-10-CM

## 2016-11-01 MED ORDER — AMPHETAMINE-DEXTROAMPHETAMINE 20 MG PO TABS: ORAL_TABLET | ORAL | 0 refills | Status: DC

## 2016-11-01 NOTE — Telephone Encounter (Signed)
°  Who's calling (name and relationship to patient) : Malachi BondsGloria (mom)  Best contact number: [phone]  Provider they see:  Reason for call: Need refill of medication.  Please mail the Rx     PRESCRIPTION REFILL ONLY  Name of prescription: Adderall   Pharmacy:

## 2016-11-01 NOTE — Telephone Encounter (Signed)
Rx filled and pending provider signature.

## 2016-11-07 ENCOUNTER — Telehealth: Payer: Self-pay | Admitting: Pediatrics

## 2016-11-07 NOTE — Telephone Encounter (Signed)
  Who's calling (name and relationship to patient) :mom; Wilhemena DurieGloria  Best contact number:[phone]  Provider they WUJ:[id number]:Hickling  Reason for call:Mom stated that see called in last week for Rx refill on Adderall but has not received it yet by mail. Give mom a call to let her know when mailed.     PRESCRIPTION REFILL ONLY  Name of prescription:  Pharmacy:

## 2016-11-07 NOTE — Telephone Encounter (Signed)
The prescription arrived in the mail today.

## 2016-12-10 ENCOUNTER — Telehealth (INDEPENDENT_AMBULATORY_CARE_PROVIDER_SITE_OTHER): Payer: Self-pay | Admitting: Pediatrics

## 2016-12-10 DIAGNOSIS — F902 Attention-deficit hyperactivity disorder, combined type: Secondary | ICD-10-CM

## 2016-12-10 MED ORDER — AMPHETAMINE-DEXTROAMPHETAMINE 20 MG PO TABS: ORAL_TABLET | ORAL | 0 refills | Status: DC

## 2016-12-10 NOTE — Telephone Encounter (Signed)
  Who's calling (name and relationship to patient) : Malachi BondsGloria, grandmother  Best contact number: [phone]  Provider they see: Sharene SkeansHickling  Reason for call: Grandmother called in for a refill on Adderall.  Please mail prescription to her house.     PRESCRIPTION REFILL ONLY  Name of prescription: Adderall  Pharmacy: Please mail prescription to:  [address] [place] [id number]

## 2016-12-11 NOTE — Telephone Encounter (Signed)
Rx has been placed upfront to be mailed to home address

## 2017-01-09 ENCOUNTER — Telehealth (INDEPENDENT_AMBULATORY_CARE_PROVIDER_SITE_OTHER): Payer: Self-pay | Admitting: Pediatrics

## 2017-01-09 DIAGNOSIS — F902 Attention-deficit hyperactivity disorder, combined type: Secondary | ICD-10-CM

## 2017-01-09 MED ORDER — AMPHETAMINE-DEXTROAMPHETAMINE 20 MG PO TABS: ORAL_TABLET | ORAL | 0 refills | Status: DC

## 2017-01-09 NOTE — Telephone Encounter (Signed)
Rx signed, mailed and patient notified

## 2017-01-09 NOTE — Telephone Encounter (Signed)
°  Who's calling (name and relationship to patient) : Self Best contact number: [phone] Provider they see: Sharene SkeansHickling Reason for call:     PRESCRIPTION REFILL ONLY  Name of prescription: Adderall-will pick up when ready  Pharmacy:

## 2017-01-09 NOTE — Telephone Encounter (Signed)
Rx printed, pending provider signature

## 2017-01-09 NOTE — Telephone Encounter (Signed)
Patient called back requesting we mail the Adderall rx to [address] [place] [phone].

## 2017-02-10 ENCOUNTER — Other Ambulatory Visit (INDEPENDENT_AMBULATORY_CARE_PROVIDER_SITE_OTHER): Payer: Self-pay | Admitting: Pediatrics

## 2017-02-10 DIAGNOSIS — F902 Attention-deficit hyperactivity disorder, combined type: Secondary | ICD-10-CM

## 2017-02-10 MED ORDER — AMPHETAMINE-DEXTROAMPHETAMINE 20 MG PO TABS: ORAL_TABLET | ORAL | 0 refills | Status: DC

## 2017-02-10 NOTE — Telephone Encounter (Signed)
Prescription signed and returned to [place].

## 2017-02-10 NOTE — Telephone Encounter (Signed)
Rx has been printed and placed on Dr. Hickling's desk 

## 2017-02-10 NOTE — Telephone Encounter (Signed)
  Who's calling (name and relationship to patient) : [name], mother  Best contact number: [phone]   Provider they see: Sharene Skeans  Reason for call: Mother called in for refill on Adderall.  Please mail Rx to the home address.  Confirmed address information in chart.     PRESCRIPTION REFILL ONLY  Name of prescription: Adderall  Pharmacy: N/A

## 2017-02-14 ENCOUNTER — Encounter (INDEPENDENT_AMBULATORY_CARE_PROVIDER_SITE_OTHER): Payer: Self-pay | Admitting: Pediatrics

## 2017-02-14 NOTE — Telephone Encounter (Signed)
°  Who's calling (name and relationship to patient) :  Best contact number:  Provider they see:  Reason for call:     PRESCRIPTION REFILL ONLY  Name of prescription: ADDERALL  Pharmacy:

## 2017-03-07 ENCOUNTER — Encounter: Payer: Self-pay | Admitting: Internal Medicine

## 2017-03-18 ENCOUNTER — Telehealth (INDEPENDENT_AMBULATORY_CARE_PROVIDER_SITE_OTHER): Payer: Self-pay | Admitting: Pediatrics

## 2017-03-18 DIAGNOSIS — F902 Attention-deficit hyperactivity disorder, combined type: Secondary | ICD-10-CM

## 2017-03-18 MED ORDER — AMPHETAMINE-DEXTROAMPHETAMINE 20 MG PO TABS: ORAL_TABLET | ORAL | 0 refills | Status: DC

## 2017-03-18 NOTE — Telephone Encounter (Signed)
Rx has been printed and placed on Dr. Hickling's desk 

## 2017-03-18 NOTE — Telephone Encounter (Signed)
  Who's calling (name and relationship to patient) : [name], mother  Best contact number: [phone]  Provider they see: Sharene Skeans  Reason for call: Mother called in for Rx on Adderall.  Please mail to the home address.  Confirmed address in chart.     PRESCRIPTION REFILL ONLY  Name of prescription: Adderall  Pharmacy: N/A(Mail to home address)

## 2017-04-08 ENCOUNTER — Telehealth: Payer: Self-pay | Admitting: Internal Medicine

## 2017-04-08 NOTE — Telephone Encounter (Signed)
Pt grandmom is calling to see if patient is due for tetanus shot

## 2017-04-08 NOTE — Telephone Encounter (Signed)
Patient is due for Tetnus vaccine per Dr Fabian SharpPanosh give Tdap.  Pt will be due for another Td in 10 years.   Grandmother aware. Appt scheduled for [place] for Tdap 04/18/17 at 2pm.   Nothing further needed.

## 2017-04-14 ENCOUNTER — Telehealth (INDEPENDENT_AMBULATORY_CARE_PROVIDER_SITE_OTHER): Payer: Self-pay | Admitting: Pediatrics

## 2017-04-14 DIAGNOSIS — F902 Attention-deficit hyperactivity disorder, combined type: Secondary | ICD-10-CM

## 2017-04-14 MED ORDER — AMPHETAMINE-DEXTROAMPHETAMINE 20 MG PO TABS: ORAL_TABLET | ORAL | 0 refills | Status: DC

## 2017-04-14 NOTE — Telephone Encounter (Signed)
  Who's calling (name and relationship to patient) : Ronald Rivera, self  Best contact number: [phone]  Provider they see: Sharene SkeansHickling  Reason for call: Ronald Rivera called in for a refill on his Adderall.  He has requested that it be mailed to his address in chart.  Address has been confirmed.     PRESCRIPTION REFILL ONLY  Name of prescription: Adderall  Pharmacy: Mail to home address in chart(verified)

## 2017-04-14 NOTE — Telephone Encounter (Signed)
Rx has been printed and placed on Dr. Hickling's desk 

## 2017-04-18 ENCOUNTER — Ambulatory Visit (INDEPENDENT_AMBULATORY_CARE_PROVIDER_SITE_OTHER): Payer: 59

## 2017-04-18 DIAGNOSIS — Z23 Encounter for immunization: Secondary | ICD-10-CM | POA: Diagnosis not present

## 2017-05-16 ENCOUNTER — Other Ambulatory Visit (INDEPENDENT_AMBULATORY_CARE_PROVIDER_SITE_OTHER): Payer: Self-pay | Admitting: Pediatrics

## 2017-05-16 DIAGNOSIS — F902 Attention-deficit hyperactivity disorder, combined type: Secondary | ICD-10-CM

## 2017-05-19 ENCOUNTER — Telehealth (INDEPENDENT_AMBULATORY_CARE_PROVIDER_SITE_OTHER): Payer: Self-pay | Admitting: Pediatrics

## 2017-05-19 DIAGNOSIS — F902 Attention-deficit hyperactivity disorder, combined type: Secondary | ICD-10-CM

## 2017-05-19 MED ORDER — AMPHETAMINE-DEXTROAMPHETAMINE 20 MG PO TABS: ORAL_TABLET | ORAL | 0 refills | Status: DC

## 2017-05-19 NOTE — Telephone Encounter (Signed)
°  Who's calling (name and relationship to patient) : Malachi BondsGloria Paramedic(Granfmother) Best contact number: [phone] Provider they see: Dr. Sharene SkeansHickling  Reason for call: Grandmother called to get refill. Would like for the rx to be mailed to the house.     PRESCRIPTION REFILL ONLY  Name of prescription: Adderall  Pharmacy: [facility] [phone]  [address]

## 2017-05-19 NOTE — Telephone Encounter (Signed)
Rx has been placed upfront to be mailed to the home address 

## 2017-05-19 NOTE — Telephone Encounter (Signed)
Rx has been printed and placed on Dr. Hickling's desk 

## 2017-05-19 NOTE — Telephone Encounter (Signed)
°  Who's calling (name and relationship to patient) : Malachi BondsGloria (mom) Best contact number: [phone] Provider they see: Dr. Sharene SkeansHickling Reason for call: Mom called to request pt's adderall refill.     PRESCRIPTION REFILL ONLY  Name of prescription: Adderal Pharmacy: [facility] [phone]#[id number] [address]

## 2017-05-20 NOTE — Telephone Encounter (Signed)
This rx was mailed to the home address yesterday as requested

## 2017-06-20 ENCOUNTER — Other Ambulatory Visit (INDEPENDENT_AMBULATORY_CARE_PROVIDER_SITE_OTHER): Payer: Self-pay | Admitting: Pediatrics

## 2017-06-20 DIAGNOSIS — F902 Attention-deficit hyperactivity disorder, combined type: Secondary | ICD-10-CM

## 2017-06-20 MED ORDER — AMPHETAMINE-DEXTROAMPHETAMINE 20 MG PO TABS: ORAL_TABLET | ORAL | 0 refills | Status: DC

## 2017-06-20 NOTE — Telephone Encounter (Signed)
Signed and returned to your desk for disposition.

## 2017-06-20 NOTE — Telephone Encounter (Signed)
°  Who's calling (name and relationship to patient) : Mom/[name]   Best contact number: [phone]  Provider they see: Dr Sharene SkeansHickling   Reason for call: Mom called in requesting rx for Adderall, requested for rx to be mailed please.      PRESCRIPTION REFILL ONLY  Name of prescription: Adderall   Please mail to home: [address] [place] [phone]

## 2017-06-20 NOTE — Telephone Encounter (Signed)
Rx has been printed and placed on Dr. Hickling's desk 

## 2017-06-26 NOTE — Progress Notes (Signed)
Chief Complaint  Patient presents with  . Annual Exam    Some nagging cough - works for [employer] and is exposed to dust.     HPI: Patient  Ronald Rivera  22 y.o. comes in today for Preventive Health Care visit   ADD med   aASD   Seems to help .    Lasts 6 hours  . Sees neuro  Minor cough and gm asks about what med can be used for  His sx  No sob som pnd .  Health Maintenance  Topic Date Due  . HIV Screening  11/22/2008  . INFLUENZA VACCINE  02/27/2018 (Originally 01/15/2017)  . TETANUS/TDAP  04/19/2027   Health Maintenance Review LIFESTYLE:  Exercise:  activea t work  Tobacco/ETS:n Alcohol: n Sugar beverages: soda some Sleep:  Restless sleeper .   At least 6   Drug use: no HH of  Work:    New  GF.    5 per day   UPS  5d per week .    ROS:  GEN/ HEENT: No fever, significant weight changes sweats headaches vision problems hearing changes, CV/ PULM; No chest pain shortness of breath cough, syncope,edema  change in exercise tolerance. GI /GU: No adominal pain, vomiting, change in bowel habits. No blood in the stool. No significant GU symptoms. SKIN/HEME: ,no acute skin rashes suspicious lesions or bleeding. No lymphadenopathy, nodules, masses.  NEURO/ PSYCH:  No neurologic signs such as weakness numbness. No depression anxiety. IMM/ Allergy: No unusual infections.  Allergy .   REST of 12 system review negative except as per HPI   Past Medical History:  Diagnosis Date  . ADHD (attention deficit hyperactivity disorder)   . Autistic spectrum disorder    sees Dr Ronald Rivera  . Hx of varicella   . Left acute otitis media 03/12/2012  . STREPTOCOCCAL PHARYNGITIS 04/03/2010   Qualifier: Diagnosis of  By: Regis Bill MD, Standley Brooking   . Tics of organic origin    hx of    No past surgical history on file.  Family History  Problem Relation Age of Onset  . Cancer Paternal Grandmother        Died in her 47's    Social History   Socioeconomic History  . Marital status: Single      Spouse name: None  . Number of children: None  . Years of education: None  . Highest education level: None  Social Needs  . Financial resource strain: None  . Food insecurity - worry: None  . Food insecurity - inability: None  . Transportation needs - medical: None  . Transportation needs - non-medical: None  Occupational History  . None  Tobacco Use  . Smoking status: Never Smoker  . Smokeless tobacco: Never Used  Substance and Sexual Activity  . Alcohol use: No  . Drug use: No  . Sexual activity: No  Other Topics Concern  . 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   He is currently employed with [employer].   He lives with his grandparents.    He enjoys playing the Xbox and working.     Outpatient Medications Prior to Visit  Medication Sig Dispense Refill  . acetaminophen (TYLENOL) 500 MG tablet Take 1,000 mg by mouth every 6 (six) hours as needed for headache.    . amphetamine-dextroamphetamine (ADDERALL) 20 MG tablet Take 1 tablet daily 31 tablet 0  . cloNIDine (CATAPRES) 0.1 MG tablet  TAKE ONE HALF TABLET DAILY 16 tablet 4   No facility-administered medications prior to visit.      EXAM:  BP 138/66 (BP Location: Right Arm, Patient Position: Sitting, Cuff Size: Normal)   Pulse (!) 106   Temp 98.6 F (37 C) (Oral)   Ht 5' 7.75" (1.721 m)   Wt 126 lb 12.8 oz (57.5 kg)   BMI 19.42 kg/m   Body mass index is 19.42 kg/m. Wt Readings from Last 3 Encounters:  06/27/17 126 lb 12.8 oz (57.5 kg)  08/01/16 126 lb 12.8 oz (57.5 kg)  06/24/16 130 lb 6.4 oz (59.1 kg)    Physical Exam: Vital signs reviewed MOL:MBEM is a well-developed well-nourished alert  cooperative    who appearsr stated age in no acute distress.  HEENT: normocephalic atraumatic , Eyes: PERRL EOM's full, conjunctiva clear, Nares: paten,t no deformity discharge or tenderness., Ears: no deformity EAC's clear TMs with normal landmarks. Mouth: clear OP, no  lesions, edema.  Moist mucous membranes. Dentition in adequate repair. NECK: supple without masses, thyromegaly or bruits. CHEST/PULM:  Clear to auscultation and percussion breath sounds equal no wheeze , rales or rhonchi. No chest wall deformities or tenderness. CV: PMI is nondisplaced, S1 S2 no gallops, murmurs, rubs. Peripheral pulses are full without delay.No JVD .  ABDOMEN: Bowel sounds normal nontender  No guard or rebound, no hepato splenomegal no CVA tenderness.  No hernia. Extremtities:  No clubbing cyanosis or edema, no acute joint swelling or redness no focal atrophy NEURO:  Oriented x3, cranial nerves 3-12 appear to be intact, no obvious focal weakness,gait within normal limits no abnormal reflexes or asymmetrical SKIN: No acute rashes normal turgor, color, no bruising or petechiae. Cooperative and interactive  Some dec eye contact LN: no cervical axillary inguinal  [place]oddy  Nodes ax neck adenopathy  Lab Results  Component Value Date   WBC 9.5 09/27/2014   HGB 15.5 09/27/2014   HCT 46.0 09/27/2014   PLT 351.0 09/27/2014   GLUCOSE 191 (H) 09/13/2014   CHOL 140 09/27/2014   TRIG 83.0 09/27/2014   HDL 35.50 (L) 09/27/2014   LDLCALC 88 09/27/2014   ALT 16 09/13/2014   AST 25 09/13/2014   NA 138 09/13/2014   K 4.4 09/13/2014   CL 102 09/13/2014   CREATININE 1.10 09/13/2014   BUN 20 09/13/2014   CO2 23 09/13/2014    BP Readings from Last 3 Encounters:  06/27/17 138/66  08/01/16 100/70  06/24/16 120/70    Lab results reviewed with patient   ASSESSMENT AND PLAN:  Discussed the following assessment and plan:  Visit for preventive health examination  Autism spectrum disorder requiring support (level 1)  Attention deficit hyperactivity disorder (ADHD), combined type  Screening for diabetes mellitus - Plan: POCT glucose (manual entry) Disc immunizations such as Gardasil and influenza will hold off for now until acceptable.  Point-of-care glucose  NF  today in normal  range the elevated blood sugar he had in the record was when he was sick. Cough sounds minor and probably indicative of postnasal drainage.  Okay to give plain antihistamine without decongestant. Patient Care Team: Panosh, Standley Brooking, MD as PCP - General Ronald Rivera Princess Bruins, MD (Neurology) Patient Instructions   Healthy eating   And activity to coniinu.    Consideration of   Getting HPV9 vaccine. Ok to try claritin  xyrtec or allegra      ( not decongestants)   Consider POCT glucose .      Preventive  Care for [place], Male The transition to life after high school as a young adult can be a stressful time with many changes. You may start seeing a primary care physician instead of a pediatrician. This is the time when your health care becomes your responsibility. Preventive care refers to lifestyle choices and visits with your health care provider that can promote health and wellness. What does preventive care include?  A yearly physical exam. This is also called an annual wellness visit.  Dental exams once or twice a year.  Routine eye exams. Ask your health care provider how often you should have your eyes checked.  Personal lifestyle choices, including: ? Daily care of your teeth and gums. ? Regular physical activity. ? Eating a healthy diet. ? Avoiding tobacco and drug use. ? Avoiding or limiting alcohol use. ? Practicing safe sex. What happens during an annual wellness visit? Preventive care starts with a yearly visit to your primary care physician. The services and screenings done by your health care provider during your annual wellness visit will depend on your overall health, lifestyle risk factors, and family history of disease. Counseling Your health care provider may ask you questions about:  Past medical problems and your family's medical history.  Medicines or supplements that you take.  Health insurance and access to health care.  Alcohol, tobacco, and drug  use, including use of any bodybuilding drugs (anabolic steroids).  Your safety at home, work, or school.  Access to firearms.  Emotional well-being and how you cope with stress.  Relationship well-being.  Diet, exercise, and sleep habits.  Your sexual health and activity.  Screening You may have the following tests or measurements:  Height, weight, and BMI.  Blood pressure.  Lipid and cholesterol levels.  Tuberculosis skin test.  Skin exam.  Vision and hearing tests.  Genital exam to check for testicular cancer or hernias.  Screening test for hepatitis.  Screening tests for STDs (sexually transmitted diseases), if you are at risk.  Vaccines Your health care provider may recommend certain vaccines, such as:  Influenza vaccine. This is recommended every year.  Tetanus, diphtheria, and acellular pertussis (Tdap, Td) vaccine. You may need a Td booster every 10 years.  Varicella vaccine. You may need this if you have not been vaccinated.  HPV vaccine. If you are 47 or younger, you may need three doses over 6 months.  Measles, mumps, and rubella (MMR) vaccine. You may need at least one dose of MMR. You may also need a second dose.  Pneumococcal 13-valent conjugate (PCV13) vaccine. You may need this if you have certain conditions and have not been vaccinated.  Pneumococcal polysaccharide (PPSV23) vaccine. You may need one or two doses if you smoke cigarettes or if you have certain conditions.  Meningococcal vaccine. One dose is recommended if you are age 67-21 years and a first-year college student living in a residence hall, or if you have one of several medical conditions. You may also need additional booster doses.  Hepatitis A vaccine. You may need this if you have certain conditions or if you travel or work in places where you may be exposed to hepatitis A.  Hepatitis B vaccine. You may need this if you have certain conditions or if you travel or work in places  where you may be exposed to hepatitis B.  Haemophilus influenzae type b (Hib) vaccine. You may need this if you have certain risk factors.  Talk to your health care provider about which  screenings and vaccines you need and how often you need them. What steps can I take to develop healthy behaviors?  Have regular preventive health care visits with your primary care physician and dentist.  Eat a healthy diet.  Drink enough fluid to keep your urine clear or pale yellow.  Stay active. Exercise at least 30 minutes 5 or more days of the week.  Use alcohol responsibly.  Maintain a healthy weight.  Do not use any products that contain nicotine, such as cigarettes, chewing tobacco, and e-cigarettes. If you need help quitting, ask your health care provider.  Do not use drugs.  Practice safe sex. This includes using condoms to prevent STDs or an unwanted pregnancy.  Find healthy ways to manage stress. How can I protect myself from injury? Injuries from violence or accidents are the leading cause of death among young adults and can often be prevented. Take these steps to help protect yourself:  Always wear your seat belt while driving or riding in a vehicle.  Do not drive if you have been drinking alcohol. Do not ride with someone who has been drinking.  Do not drive when you are tired or distracted. Do not text while driving.  Wear a helmet and other protective equipment during sports activities.  If you have firearms in your house, make sure you follow all gun safety procedures.  Seek help if you have been bullied, physically abused, or sexually abused.  Avoid fighting.  Use the Internet responsibly to avoid dangers such as online bullying.  What can I do to cope with stress? Young adults may Rivera many new challenges that can be stressful, such as finding a job, going to college, moving away from home, managing money, being in a relationship, getting married, and having children.  To manage stress:  Avoid known stressful situations when you can.  Exercise regularly.  Find a stress-reducing activity that works best for you. Examples include meditation, yoga, listening to music, or reading.  Spend time in nature.  Keep a journal to write about your stress and how you respond.  Talk to your health care provider about stress. He or she may suggest counseling.  Spend time with supportive friends or family.  Do not cope with stress by: ? Drinking alcohol or using drugs. ? Smoking cigarettes. ? Eating.  Where can I get more information? Learn more about preventive care and healthy habits from:  U.S. Preventive Services Task Force: StageSync.si  National Adolescent and [place]: StrategicRoad.nl  American Academy of Pediatrics Bright Futures: https://brightfutures.MemberVerification.co.za  Society for Adolescent Health and Medicine: MoralBlog.co.za.aspx  PodExchange.nl: ToyLending.fr  This information is not intended to replace advice given to you by your health care provider. Make sure you discuss any questions you have with your health care provider. Document Released: 10/19/2015 Document Revised: 11/09/2015 Document Reviewed: 10/19/2015 Elsevier Interactive Patient Education  2018 [place]. Panosh M.D.

## 2017-06-27 ENCOUNTER — Ambulatory Visit (INDEPENDENT_AMBULATORY_CARE_PROVIDER_SITE_OTHER): Payer: 59 | Admitting: Internal Medicine

## 2017-06-27 ENCOUNTER — Encounter: Payer: Self-pay | Admitting: Internal Medicine

## 2017-06-27 VITALS — BP 138/66 | HR 106 | Temp 98.6°F | Ht 67.75 in | Wt 126.8 lb

## 2017-06-27 DIAGNOSIS — Z131 Encounter for screening for diabetes mellitus: Secondary | ICD-10-CM

## 2017-06-27 DIAGNOSIS — F902 Attention-deficit hyperactivity disorder, combined type: Secondary | ICD-10-CM | POA: Diagnosis not present

## 2017-06-27 DIAGNOSIS — Z0000 Encounter for general adult medical examination without abnormal findings: Secondary | ICD-10-CM

## 2017-06-27 DIAGNOSIS — F840 Autistic disorder: Secondary | ICD-10-CM

## 2017-06-27 LAB — GLUCOSE, POCT (MANUAL RESULT ENTRY): POC GLUCOSE: 98 mg/dL (ref 70–99)

## 2017-06-27 NOTE — Patient Instructions (Addendum)
Healthy eating   And activity to coniinu.    Consideration of   Getting HPV9 vaccine. Ok to try claritin  xyrtec or allegra      ( not decongestants)   Consider POCT glucose .      Preventive Care for [place], Male The transition to life after high school as a young adult can be a stressful time with many changes. You may start seeing a primary care physician instead of a pediatrician. This is the time when your health care becomes your responsibility. Preventive care refers to lifestyle choices and visits with your health care provider that can promote health and wellness. What does preventive care include?  A yearly physical exam. This is also called an annual wellness visit.  Dental exams once or twice a year.  Routine eye exams. Ask your health care provider how often you should have your eyes checked.  Personal lifestyle choices, including: ? Daily care of your teeth and gums. ? Regular physical activity. ? Eating a healthy diet. ? Avoiding tobacco and drug use. ? Avoiding or limiting alcohol use. ? Practicing safe sex. What happens during an annual wellness visit? Preventive care starts with a yearly visit to your primary care physician. The services and screenings done by your health care provider during your annual wellness visit will depend on your overall health, lifestyle risk factors, and family history of disease. Counseling Your health care provider may ask you questions about:  Past medical problems and your family's medical history.  Medicines or supplements that you take.  Health insurance and access to health care.  Alcohol, tobacco, and drug use, including use of any bodybuilding drugs (anabolic steroids).  Your safety at home, work, or school.  Access to firearms.  Emotional well-being and how you cope with stress.  Relationship well-being.  Diet, exercise, and sleep habits.  Your sexual health and activity.  Screening You may have the  following tests or measurements:  Height, weight, and BMI.  Blood pressure.  Lipid and cholesterol levels.  Tuberculosis skin test.  Skin exam.  Vision and hearing tests.  Genital exam to check for testicular cancer or hernias.  Screening test for hepatitis.  Screening tests for STDs (sexually transmitted diseases), if you are at risk.  Vaccines Your health care provider may recommend certain vaccines, such as:  Influenza vaccine. This is recommended every year.  Tetanus, diphtheria, and acellular pertussis (Tdap, Td) vaccine. You may need a Td booster every 10 years.  Varicella vaccine. You may need this if you have not been vaccinated.  HPV vaccine. If you are 45 or younger, you may need three doses over 6 months.  Measles, mumps, and rubella (MMR) vaccine. You may need at least one dose of MMR. You may also need a second dose.  Pneumococcal 13-valent conjugate (PCV13) vaccine. You may need this if you have certain conditions and have not been vaccinated.  Pneumococcal polysaccharide (PPSV23) vaccine. You may need one or two doses if you smoke cigarettes or if you have certain conditions.  Meningococcal vaccine. One dose is recommended if you are age 75-21 years and a first-year college student living in a residence hall, or if you have one of several medical conditions. You may also need additional booster doses.  Hepatitis A vaccine. You may need this if you have certain conditions or if you travel or work in places where you may be exposed to hepatitis A.  Hepatitis B vaccine. You may need this if you  have certain conditions or if you travel or work in places where you may be exposed to hepatitis B.  Haemophilus influenzae type b (Hib) vaccine. You may need this if you have certain risk factors.  Talk to your health care provider about which screenings and vaccines you need and how often you need them. What steps can I take to develop healthy behaviors?  Have  regular preventive health care visits with your primary care physician and dentist.  Eat a healthy diet.  Drink enough fluid to keep your urine clear or pale yellow.  Stay active. Exercise at least 30 minutes 5 or more days of the week.  Use alcohol responsibly.  Maintain a healthy weight.  Do not use any products that contain nicotine, such as cigarettes, chewing tobacco, and e-cigarettes. If you need help quitting, ask your health care provider.  Do not use drugs.  Practice safe sex. This includes using condoms to prevent STDs or an unwanted pregnancy.  Find healthy ways to manage stress. How can I protect myself from injury? Injuries from violence or accidents are the leading cause of death among young adults and can often be prevented. Take these steps to help protect yourself:  Always wear your seat belt while driving or riding in a vehicle.  Do not drive if you have been drinking alcohol. Do not ride with someone who has been drinking.  Do not drive when you are tired or distracted. Do not text while driving.  Wear a helmet and other protective equipment during sports activities.  If you have firearms in your house, make sure you follow all gun safety procedures.  Seek help if you have been bullied, physically abused, or sexually abused.  Avoid fighting.  Use the Internet responsibly to avoid dangers such as online bullying.  What can I do to cope with stress? Young adults may face many new challenges that can be stressful, such as finding a job, going to college, moving away from home, managing money, being in a relationship, getting married, and having children. To manage stress:  Avoid known stressful situations when you can.  Exercise regularly.  Find a stress-reducing activity that works best for you. Examples include meditation, yoga, listening to music, or reading.  Spend time in nature.  Keep a journal to write about your stress and how you  respond.  Talk to your health care provider about stress. He or she may suggest counseling.  Spend time with supportive friends or family.  Do not cope with stress by: ? Drinking alcohol or using drugs. ? Smoking cigarettes. ? Eating.  Where can I get more information? Learn more about preventive care and healthy habits from:  U.S. Preventive Services Task Force: StageSync.si  National Adolescent and [place]: StrategicRoad.nl  American Academy of Pediatrics Bright Futures: https://brightfutures.MemberVerification.co.za  Society for Adolescent Health and Medicine: MoralBlog.co.za.aspx  PodExchange.nl: ToyLending.fr  This information is not intended to replace advice given to you by your health care provider. Make sure you discuss any questions you have with your health care provider. Document Released: 10/19/2015 Document Revised: 11/09/2015 Document Reviewed: 10/19/2015 Elsevier Interactive Patient Education  [employer].

## 2017-07-21 ENCOUNTER — Other Ambulatory Visit (INDEPENDENT_AMBULATORY_CARE_PROVIDER_SITE_OTHER): Payer: Self-pay | Admitting: Pediatrics

## 2017-07-21 DIAGNOSIS — F902 Attention-deficit hyperactivity disorder, combined type: Secondary | ICD-10-CM

## 2017-07-21 MED ORDER — AMPHETAMINE-DEXTROAMPHETAMINE 20 MG PO TABS: ORAL_TABLET | ORAL | 0 refills | Status: DC

## 2017-07-21 NOTE — Telephone Encounter (Signed)
°  Who's calling (name and relationship to patient) : Malachi BondsGloria [facility]) Best contact number: [phone] Provider they see: Dr. Sharene SkeansHickling Reason for call: Mom called to ask for rx refill on Adderall. Mom would like rx mailed to her home address.

## 2017-07-21 NOTE — Telephone Encounter (Signed)
Rx has been placed upfront to be mailed to the home address 

## 2017-07-21 NOTE — Telephone Encounter (Signed)
Rx has been printed and placed on Dr. Hickling's desk 

## 2017-08-18 ENCOUNTER — Telehealth (INDEPENDENT_AMBULATORY_CARE_PROVIDER_SITE_OTHER): Payer: Self-pay | Admitting: Pediatrics

## 2017-08-18 DIAGNOSIS — F902 Attention-deficit hyperactivity disorder, combined type: Secondary | ICD-10-CM

## 2017-08-18 MED ORDER — AMPHETAMINE-DEXTROAMPHETAMINE 20 MG PO TABS: ORAL_TABLET | ORAL | 0 refills | Status: DC

## 2017-08-18 NOTE — Telephone Encounter (Signed)
°  Who's calling (name and relationship to patient) : Malachi BondsGloria [facility]([place]) Best contact number: [phone] Provider they see: Dr. Sharene SkeansHickling Reason for call: Mom called to request refill on pt's Adderall. Mom would like for rx to be mailed to home address.

## 2017-08-18 NOTE — Telephone Encounter (Signed)
Rx mailed to patient per their request.

## 2017-09-22 ENCOUNTER — Telehealth (INDEPENDENT_AMBULATORY_CARE_PROVIDER_SITE_OTHER): Payer: Self-pay | Admitting: Pediatrics

## 2017-09-22 DIAGNOSIS — F902 Attention-deficit hyperactivity disorder, combined type: Secondary | ICD-10-CM

## 2017-09-22 MED ORDER — AMPHETAMINE-DEXTROAMPHETAMINE 20 MG PO TABS: ORAL_TABLET | ORAL | 0 refills | Status: DC

## 2017-09-22 NOTE — Telephone Encounter (Signed)
Please inform the family that I have sent the prescription directly to the pharmacy.

## 2017-09-22 NOTE — Telephone Encounter (Signed)
°  Who's calling (name and relationship to patient) : Malachi BondsGloria - mom  Best contact number: [phone]  Provider they see: Sharene SkeansHickling  Reason for call: Request that script for medication below be mailed to home.    PRESCRIPTION REFILL ONLY  Name of prescription: amphetamine-dextroamphetamine (ADDERALL) 20 MG tablet  Pharmacy:

## 2017-09-23 NOTE — Telephone Encounter (Signed)
Spoke with Malachi BondsGloria to inform her that Dr. Sharene SkeansHickling sent in the prescription electronically yesterday

## 2017-10-20 ENCOUNTER — Telehealth (INDEPENDENT_AMBULATORY_CARE_PROVIDER_SITE_OTHER): Payer: Self-pay | Admitting: Pediatrics

## 2017-10-20 DIAGNOSIS — F902 Attention-deficit hyperactivity disorder, combined type: Secondary | ICD-10-CM

## 2017-10-20 MED ORDER — AMPHETAMINE-DEXTROAMPHETAMINE 20 MG PO TABS: ORAL_TABLET | ORAL | 0 refills | Status: DC

## 2017-10-20 NOTE — Telephone Encounter (Signed)
Prescription has been electronically sent. 

## 2017-10-20 NOTE — Telephone Encounter (Signed)
°  Who's calling (name and relationship to patient) : [name] - mother  Best contact number: [phone]  Provider they see: Sharene Skeans  Reason for call: requesting refill on medication below     PRESCRIPTION REFILL ONLY  Name of prescription: amphetamine-dextroamphetamine (ADDERALL) 20 MG tablet  [id number] [place], [place] - [address]

## 2017-10-24 ENCOUNTER — Encounter (INDEPENDENT_AMBULATORY_CARE_PROVIDER_SITE_OTHER): Payer: Self-pay | Admitting: Pediatrics

## 2017-10-24 ENCOUNTER — Ambulatory Visit (INDEPENDENT_AMBULATORY_CARE_PROVIDER_SITE_OTHER): Payer: 59 | Admitting: Pediatrics

## 2017-10-24 VITALS — BP 130/80 | HR 64 | Ht 68.5 in | Wt 122.2 lb

## 2017-10-24 DIAGNOSIS — G2569 Other tics of organic origin: Secondary | ICD-10-CM | POA: Diagnosis not present

## 2017-10-24 DIAGNOSIS — F840 Autistic disorder: Secondary | ICD-10-CM | POA: Diagnosis not present

## 2017-10-24 DIAGNOSIS — F902 Attention-deficit hyperactivity disorder, combined type: Secondary | ICD-10-CM

## 2017-10-24 MED ORDER — CLONIDINE HCL 0.1 MG PO TABS: ORAL_TABLET | ORAL | 5 refills | Status: DC

## 2017-10-24 NOTE — Patient Instructions (Signed)
I am so proud of what you have accomplished at [employer].  Come back and see me in a year.  I will see you sooner if needed.

## 2017-10-24 NOTE — Progress Notes (Signed)
Patient: Ronald Rivera MRN: [id number] Sex: male DOB: June 18, 1995  Provider: Ellison Carwin, MD Location of Care: [facility]  Note type: Routine return visit  History of Present Illness: Referral Source: Nona Dell, MD History from: grandmother, patient and [facility] chart Chief Complaint: Tics/Autism/ADHD   Ronald Rivera is a 22 y.o. male who returns on Oct 24, 2017 for the first time since August 01, 2016.  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  He graduated from high school in June 2015.    He has worked in his Horticulturist, commercial business, and at [employer].  Currently, he is working at [employer].  His story is a remarkable one.  He thought that he hurt himself on the job at [employer] and was told to file a workmen's compensation claim.  He went home and discovered that he had not hurt himself, but there was a problem with his shoe.  He went back to tell the staff that he had not hurt himself and was withdrawing the claim, he was fired.  It seems unjust  because he initially did what he was told to do by the staff.  He did the right thing when he went back at his mother's urging to rescind his claim.  He was "rewarded" for this by being fired.    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  This is a demanding physical job.  The staff was complimentary of his work Associate Professor.  He was able to outperform many of the employees in terms of the number of parcels unloaded.  He injured his back and decided that he would be better off in a situation where he was not having to pick up large objects.    He studied for and ultimately passed an exam in sorting, which requires him to memorize zip codes and to take packages that have zip codes on them and put them on the correct moving belt that will send them to the proper location for collection and transport.  He initially declined to finish his first test because he  knew that he was doing poorly and asked to be able to study and return to take the test.  He scored a 90 on it and he is performing extremely well in his job.  He has an autism coach who comes to see him twice a month.  He described as a Chief Executive Officer.  He is well liked by the other workers who appreciate his work ethic and have been very supportive.  In my experience, one of the biggest problems in jobs like [employer] is that there is little margin for error.  It is not tolerated and people are fired.  It also is fairly heavy manual labor and is a demanding job.  This would be a situation where one might think that [name]'s inability to pick up cues from other people would work against him.  On the contrary, because he is well liked, and people feel that he is working as efficiently and as hard as any other employee, he is experiencing affirmation from his coworkers and this has build his self-esteem.  He is a very reliable and Arts development officer.  After he stops working, he comes home and plays video games.  He also enjoys playing basketball in his driveway.  He is physically healthy.  He is sleeping well.  His weight is stable.  He takes and tolerates  clonidine, which controls motor tics and generic Adderall for his attention deficit disorder.  There is no reason to change these medications, although it appears to me that they have been tapered downward somewhat since he was seen 14 months ago.  Review of Systems: A complete review of systems was assessed and was negative.  Past Medical History Diagnosis Date  . ADHD (attention deficit hyperactivity disorder)   . Autistic spectrum disorder    sees Dr Sharene Skeans  . Hx of varicella   . Left acute otitis media 03/12/2012  . STREPTOCOCCAL PHARYNGITIS 04/03/2010   Qualifier: Diagnosis of  By: Fabian Sharp MD, Neta Mends   . Tics of organic origin    hx of   Hospitalizations: No., Head Injury: No., Nervous System Infections: No., Immunizations up to date: Yes.     Behavior History Autism spectrum disorder, level 1  Surgical History History reviewed. No pertinent surgical history.  Family History family history includes Cancer in his paternal grandmother. Family history is negative for migraines, seizures, intellectual disabilities, blindness, deafness, birth defects, chromosomal disorder, or autism.  Social History Socioeconomic History  . Marital status: Single  . Years of education:  45  . Highest education level:  High school  Occupational History  . Not on file  Social Needs  . Financial resource strain: Not on file  . Food insecurity:    Worry: Not on file    Inability: Not on file  . Transportation needs:    Medical: Not on file    Non-medical: Not on file  Tobacco Use  . Smoking status: Never Smoker  . Smokeless tobacco: Never Used  Substance and Sexual Activity  . Alcohol use: No  . Drug use: No  . 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    He is currently employed with [employer].    He lives with his grandparents.     He enjoys playing the Xbox and working.    Allergies Allergen Reactions  . Aripiprazole Other (See Comments)    Causes seizures   Physical Exam BP 130/80   Pulse 64   Ht 5' 8.5" (1.74 m)   Wt 122 lb 3.2 oz (55.4 kg)   BMI 18.31 kg/m   General: alert, well developed, well nourished, in no acute distress, blond hair, blue eyes, right handed Head: normocephalic, no dysmorphic features Ears, Nose and Throat: Otoscopic: tympanic membranes normal; pharynx: oropharynx is pink without exudates or tonsillar hypertrophy Neck: supple, full range of motion, no cranial or cervical bruits Respiratory: auscultation clear Cardiovascular: no murmurs, pulses are normal Musculoskeletal: no skeletal deformities or apparent scoliosis Skin: no rashes or neurocutaneous lesions  Neurologic Exam  Mental Status: alert; oriented to person, place and year;  knowledge is slightly below normal for age; language is normal and concrete, eye contact is intermittent, he was cooperative and pleasant; he was very pleased with my praise for his efforts Cranial Nerves: visual fields are full to double simultaneous stimuli; extraocular movements are full and conjugate; pupils are round reactive to light; funduscopic examination shows sharp disc margins with normal vessels; symmetric facial strength; midline tongue and uvula; air conduction is greater than bone conduction bilaterally; no tics were seen in his face, no vocal tics Motor: Normal strength, tone and mass; good fine motor movements; no pronator drift; no tics were seen Sensory: intact responses to cold, vibration, proprioception and stereognosis Coordination: good finger-to-nose, rapid repetitive alternating movements and finger  apposition Gait and Station: normal gait and station: patient is able to walk on heels, toes and tandem without difficulty; balance is adequate; Romberg exam is negative; Gower response is negative Reflexes: symmetric and diminished bilaterally; no clonus; bilateral flexor plantar responses  Assessment 1. Autism spectrum disorder requiring support, level 1, F84.0. 2. Tics of organic origin, G25.69. 3. Attention deficit hyperactivity disorder, combined type, F90.2.  Discussion Ronald Rivera is stable and is doing extremely well.  He is one of the few patients that I have is an adult, who has been very successful in the public workplace.  His tics are in good control and therefore clonidine should not be changed.  His attention span is well controlled with Adderall.  I am very proud of what he has accomplished at [employer] and pleased with the supervisors and workers there who have supported and accommodated him while at the same time he has justified that support because of his ability to work alongside without any modifications.  Plan I refilled his prescription for clonidine and generic  Adderall.  I will see him in follow-up in a year.  I will be happy to see him sooner based on any changes in his condition.  I spent 25 minutes of face-to-face time with Ronald Rivera and his grandmother.  We discussed his medical conditions and spend time working through his work history which is remarkable.   Medication List    Accurate as of 10/24/17  4:19 PM.      acetaminophen 500 MG tablet Commonly known as:  TYLENOL Take 1,000 mg by mouth every 6 (six) hours as needed for headache.   amphetamine-dextroamphetamine 20 MG tablet Commonly known as:  ADDERALL Take 1 tablet daily   cloNIDine 0.1 MG tablet Commonly known as:  CATAPRES TAKE ONE HALF TABLET DAILY    The medication list was reviewed and reconciled. All changes or newly prescribed medications were explained.  A complete medication list was provided to the patient/caregiver.  Deetta Perla MD

## 2017-11-24 ENCOUNTER — Other Ambulatory Visit (INDEPENDENT_AMBULATORY_CARE_PROVIDER_SITE_OTHER): Payer: Self-pay | Admitting: Pediatrics

## 2017-11-24 DIAGNOSIS — F902 Attention-deficit hyperactivity disorder, combined type: Secondary | ICD-10-CM

## 2017-11-24 MED ORDER — AMPHETAMINE-DEXTROAMPHETAMINE 20 MG PO TABS: ORAL_TABLET | ORAL | 0 refills | Status: DC

## 2017-11-24 NOTE — Telephone Encounter (Signed)
°  Who's calling (name and relationship to patient) : Malachi BondsGloria [facility]) Best contact number: [phone] Provider they see: Dr. Sharene SkeansHickling  Reason for call: Mom lvm requesting Adderall refill.

## 2017-11-24 NOTE — Telephone Encounter (Signed)
Please send refill to the pharmacy.

## 2017-11-24 NOTE — Telephone Encounter (Signed)
Prescription was electronically sent. 

## 2017-12-22 ENCOUNTER — Other Ambulatory Visit (INDEPENDENT_AMBULATORY_CARE_PROVIDER_SITE_OTHER): Payer: Self-pay | Admitting: Pediatrics

## 2017-12-22 DIAGNOSIS — F902 Attention-deficit hyperactivity disorder, combined type: Secondary | ICD-10-CM

## 2017-12-22 MED ORDER — AMPHETAMINE-DEXTROAMPHETAMINE 20 MG PO TABS: ORAL_TABLET | ORAL | 0 refills | Status: DC

## 2017-12-22 NOTE — Telephone Encounter (Signed)
Please send this to the pharmacy 

## 2017-12-22 NOTE — Telephone Encounter (Signed)
°  Who's calling (name and relationship to patient) : Malachi BondsGloria, mother Best contact number: [phone] Provider they see: Sharene SkeansHickling Reason for call: Mother left a voicemail at 8:06AM requesting a refill for Adderall. I returned her call at 8:23AM advising I would let the clinic staff know.     PRESCRIPTION REFILL ONLY  Name of prescription: Adderall-please call when ready  Pharmacy:

## 2018-01-23 ENCOUNTER — Telehealth: Payer: Self-pay | Admitting: Pediatrics

## 2018-01-23 DIAGNOSIS — F902 Attention-deficit hyperactivity disorder, combined type: Secondary | ICD-10-CM

## 2018-01-23 MED ORDER — AMPHETAMINE-DEXTROAMPHETAMINE 20 MG PO TABS: ORAL_TABLET | ORAL | 0 refills | Status: DC

## 2018-01-23 NOTE — Telephone Encounter (Signed)
Prescription has been sent electronically.

## 2018-01-23 NOTE — Telephone Encounter (Signed)
°  Who's calling (name and relationship to patient) : Donaciano EvaGillett, Aceton [name] (Self)  Best contact number: [phone]  Provider they see: Sharene SkeansHickling  Reason for call: requesting refill on medication below      PRESCRIPTION REFILL ONLY  Name of prescription: amphetamine-dextroamphetamine (ADDERALL) 20 MG tablet  [id number] [place], [place] - [address]

## 2018-02-25 ENCOUNTER — Other Ambulatory Visit (INDEPENDENT_AMBULATORY_CARE_PROVIDER_SITE_OTHER): Payer: Self-pay | Admitting: Pediatrics

## 2018-02-25 DIAGNOSIS — F902 Attention-deficit hyperactivity disorder, combined type: Secondary | ICD-10-CM

## 2018-02-25 MED ORDER — AMPHETAMINE-DEXTROAMPHETAMINE 20 MG PO TABS: ORAL_TABLET | ORAL | 0 refills | Status: DC

## 2018-02-25 NOTE — Telephone Encounter (Signed)
°  Who's calling (name and relationship to patient) : Mom/[name]  Best contact number:[phone]  Provider they see:Dr Hickling  Reason for call: Ms Gleason called in requesting a refill on medication, pt only has 2-3 pills left.       PRESCRIPTION REFILL ONLY  Name of prescription: amphetamine-dextroamphetamine (ADDERALL) 20 MG tablet  [facility] [address]

## 2018-02-25 NOTE — Telephone Encounter (Signed)
Please send rx to the pharmacy 

## 2018-02-28 ENCOUNTER — Telehealth (INDEPENDENT_AMBULATORY_CARE_PROVIDER_SITE_OTHER): Payer: Self-pay | Admitting: Pediatrics

## 2018-02-28 DIAGNOSIS — F902 Attention-deficit hyperactivity disorder, combined type: Secondary | ICD-10-CM

## 2018-02-28 MED ORDER — AMPHETAMINE-DEXTROAMPHETAMINE 20 MG PO TABS: ORAL_TABLET | ORAL | 0 refills | Status: DC

## 2018-02-28 NOTE — Telephone Encounter (Signed)
Grandmother called, pharmacy did not have enough medication for full prescription of Adderall and would like me to call it in to another store.  I called the pharmacy and confirmed [facility] [address]  Prescription sent, pharmacy to call grandmother to inform her it was sent and where.   Lorenz CoasterStephanie Orville Widmann MD MPH

## 2018-03-03 ENCOUNTER — Telehealth (INDEPENDENT_AMBULATORY_CARE_PROVIDER_SITE_OTHER): Payer: Self-pay | Admitting: Pediatrics

## 2018-03-03 NOTE — Telephone Encounter (Signed)
Who's calling (name and relationship to patient) : Malachi BondsGloria Database administrator(Grandmother) Best contact number: [phone] Provider they see: Dr. Sharene SkeansHickling Reason for call: Malachi BondsGloria stated that pt's pharmacy was out of Adderall and could not fill pt's rx. On call Provider handled this encounter and had the rx transferred to another pharmacy.    Call ID: [phone]

## 2018-03-23 ENCOUNTER — Other Ambulatory Visit (INDEPENDENT_AMBULATORY_CARE_PROVIDER_SITE_OTHER): Payer: Self-pay | Admitting: Pediatrics

## 2018-03-23 DIAGNOSIS — G2569 Other tics of organic origin: Secondary | ICD-10-CM

## 2018-03-25 ENCOUNTER — Telehealth (INDEPENDENT_AMBULATORY_CARE_PROVIDER_SITE_OTHER): Payer: Self-pay | Admitting: Pediatrics

## 2018-03-25 DIAGNOSIS — F902 Attention-deficit hyperactivity disorder, combined type: Secondary | ICD-10-CM

## 2018-03-25 MED ORDER — AMPHETAMINE-DEXTROAMPHETAMINE 20 MG PO TABS: ORAL_TABLET | ORAL | 0 refills | Status: DC

## 2018-03-25 NOTE — Telephone Encounter (Signed)
Prescription was refilled and electronically sent.

## 2018-03-25 NOTE — Telephone Encounter (Signed)
Please send this prescription to the pharmacy 

## 2018-03-25 NOTE — Telephone Encounter (Signed)
°  Who's calling (name and relationship to patient) : [name] [facility]) Best contact number: [phone] Provider they see: Dr. Sharene Skeans Reason for call: [name] requesting refill on pt's Adderall.      PRESCRIPTION REFILL ONLY  Name of prescription: Adderall [facility] [address]

## 2018-04-10 ENCOUNTER — Telehealth (INDEPENDENT_AMBULATORY_CARE_PROVIDER_SITE_OTHER): Payer: Self-pay | Admitting: Pediatrics

## 2018-04-10 NOTE — Telephone Encounter (Signed)
I called Ronald Rivera back and left her a vm to let her know Dr. Sharene Skeans and Inetta Fermo are out of the office today but would return on Monday. I let her know I would relay her message and we would call her if more information is needed or when letter is complete.

## 2018-04-10 NOTE — Telephone Encounter (Signed)
°  Who's calling (name and relationship to patient) : [name] Gleason (Grandmother)  Best contact number: [phone]  Provider they [id number]  Reason for call: [name] called to see if we could write a letter stating that [name] can not serve for [employer] duty and explanation as to why. When letter is complete she would like it mailed to them.     PRESCRIPTION REFILL ONLY  Name of prescription:  Pharmacy:

## 2018-04-13 ENCOUNTER — Encounter (INDEPENDENT_AMBULATORY_CARE_PROVIDER_SITE_OTHER): Payer: Self-pay | Admitting: Pediatrics

## 2018-04-13 NOTE — Telephone Encounter (Signed)
Letter has been placed on Dr. Darl Householder desk for [employer]

## 2018-04-13 NOTE — Telephone Encounter (Signed)
Letter has been dictated and signed. 

## 2018-04-14 NOTE — Telephone Encounter (Signed)
Spoke with Ronald Rivera to inform her that the letter requested is ready. She asked if the letter can be mailed to the home address. The letter has been placed up front to be mailed

## 2018-04-27 ENCOUNTER — Other Ambulatory Visit (INDEPENDENT_AMBULATORY_CARE_PROVIDER_SITE_OTHER): Payer: Self-pay | Admitting: Pediatrics

## 2018-04-27 DIAGNOSIS — F902 Attention-deficit hyperactivity disorder, combined type: Secondary | ICD-10-CM

## 2018-04-27 MED ORDER — AMPHETAMINE-DEXTROAMPHETAMINE 20 MG PO TABS: ORAL_TABLET | ORAL | 0 refills | Status: DC

## 2018-04-27 NOTE — Telephone Encounter (Signed)
Please send to the pharmacy °

## 2018-04-27 NOTE — Telephone Encounter (Signed)
°  Who's calling (name and relationship to patient) : [name], grandmother (DPR on file)  Best contact number: [phone]  Provider they see: Sharene Skeans  Reason for call:     PRESCRIPTION REFILL ONLY  Name of prescription: Adderall  [facility] and see if they have this in stock before sending. [facility]

## 2018-05-01 ENCOUNTER — Encounter: Payer: Self-pay | Admitting: Family Medicine

## 2018-05-01 ENCOUNTER — Encounter: Payer: Self-pay | Admitting: *Deleted

## 2018-05-01 ENCOUNTER — Ambulatory Visit: Payer: BLUE CROSS/BLUE SHIELD | Admitting: Family Medicine

## 2018-05-01 VITALS — BP 124/80 | HR 81 | Temp 97.8°F | Resp 12 | Ht 68.5 in | Wt 127.1 lb

## 2018-05-01 DIAGNOSIS — R05 Cough: Secondary | ICD-10-CM

## 2018-05-01 DIAGNOSIS — R519 Headache, unspecified: Secondary | ICD-10-CM

## 2018-05-01 DIAGNOSIS — R51 Headache: Secondary | ICD-10-CM

## 2018-05-01 DIAGNOSIS — R059 Cough, unspecified: Secondary | ICD-10-CM

## 2018-05-01 DIAGNOSIS — J069 Acute upper respiratory infection, unspecified: Secondary | ICD-10-CM | POA: Diagnosis not present

## 2018-05-01 LAB — POCT INFLUENZA A/B
INFLUENZA A, POC: NEGATIVE
Influenza B, POC: NEGATIVE

## 2018-05-01 MED ORDER — BENZONATATE 100 MG PO CAPS: 200.0000 mg | ORAL_CAPSULE | Freq: Two times a day (BID) | ORAL | 0 refills | Status: AC | PRN

## 2018-05-01 MED ORDER — FLUTICASONE PROPIONATE 50 MCG/ACT NA SUSP: 1.0000 | Freq: Two times a day (BID) | NASAL | 0 refills | Status: DC

## 2018-05-01 NOTE — Patient Instructions (Addendum)
A few things to remember from today's visit:   URI, acute - Plan: fluticasone (FLONASE) 50 MCG/ACT nasal spray  Cough - Plan: benzonatate (TESSALON) 100 MG capsule  viral infections are self-limited and we treat each symptom depending of severity.  Over the counter medications as decongestants and cold medications usually help, they need to be taken with caution if there is a history of high blood pressure or palpitations. Tylenol and/or Ibuprofen also helps with most symptoms (headache, muscle aching, fever,etc) Plenty of fluids. Honey helps with cough. Steam inhalations helps with runny nose, nasal congestion, and may prevent sinus infections. Cough and nasal congestion could last a few days and sometimes weeks. Please follow in not any better in 10-14 days or if symptoms get worse.  Please be sure medication list is accurate. If a new problem present, please set up appointment sooner than planned today.

## 2018-05-01 NOTE — Progress Notes (Signed)
ACUTE VISIT  HPI:  Chief Complaint  Patient presents with  . Cough    sx started yesterday, with green mucus  . Nasal Congestion    Ronald Rivera is a 22 y.o.male here today with his grandmother complaining of of respiratory symptoms x 1 days. Yesterday he started with productive cough with greenish sputum, he denies hemoptysis. "Little" sore throat, exacerbated by coughing. Cough is interfering with his sleep.  He denies any stridor or dysphasia. He has not noted fever, chills, body aches, or more fatigue than usual.  Intermittent parietal headache also since yesterday, 2-3/10.  Headache is not associated with nausea, vomiting, photophobia, or visual changes. He has had similar headaches in the past. He has not identified exacerbating or alleviating factors for headache. Tylenol has helped.  He needs a note for his employer, [employer], so he can stay home today.  URI   This is a new problem. The current episode started yesterday. The problem has been unchanged. There has been no fever. Associated symptoms include congestion, coughing, headaches, rhinorrhea and a sore throat. Pertinent negatives include no abdominal pain, chest pain, diarrhea, ear pain, nausea, neck pain, plugged ear sensation, rash, sinus pain, sneezing, swollen glands, vomiting or wheezing. He has tried acetaminophen and decongestant for the symptoms. The treatment provided moderate relief.    No Hx of recent travel. No sick contact. No known insect bite.  Hx of allergies: Denies.  Listed on problem list is nasal congestion and "history of allergy."   OTC medications for this problem: Robitussin, Sudafed, and Tylenol.    Review of Systems  Constitutional: Positive for fatigue. Negative for activity change, appetite change, chills and fever.  HENT: Positive for congestion, rhinorrhea and sore throat. Negative for ear pain, mouth sores, postnasal drip, sinus pain, sneezing, trouble  swallowing and voice change.   Eyes: Negative for discharge, redness and itching.  Respiratory: Positive for cough. Negative for chest tightness, shortness of breath and wheezing.   Cardiovascular: Negative for chest pain.  Gastrointestinal: Negative for abdominal pain, diarrhea, nausea and vomiting.  Musculoskeletal: Negative for myalgias and neck pain.  Skin: Negative for rash.  Allergic/Immunologic: Negative for environmental allergies.  Neurological: Positive for headaches. Negative for weakness.  Psychiatric/Behavioral: Positive for sleep disturbance. Negative for confusion.      Current Outpatient Medications on File Prior to Visit  Medication Sig Dispense Refill  . acetaminophen (TYLENOL) 500 MG tablet Take 1,000 mg by mouth every 6 (six) hours as needed for headache.    . amphetamine-dextroamphetamine (ADDERALL) 20 MG tablet Take 1 tablet daily 31 tablet 0  . cloNIDine (CATAPRES) 0.1 MG tablet TAKE 1/2 TABLET IN THE MORNING 45 tablet 2   No current facility-administered medications on file prior to visit.      Past Medical History:  Diagnosis Date  . ADHD (attention deficit hyperactivity disorder)   . Autistic spectrum disorder    sees Dr Sharene Skeans  . Hx of varicella   . Left acute otitis media 03/12/2012  . STREPTOCOCCAL PHARYNGITIS 04/03/2010   Qualifier: Diagnosis of  By: Fabian Sharp MD, Neta Mends   . Tics of organic origin    hx of   Allergies  Allergen Reactions  . Aripiprazole Other (See Comments)    Causes seizures    Social History   Socioeconomic History  . Marital status: Single    Spouse name: Not on file  . Number of children: Not on file  . Years of education: Not  on file  . Highest education level: Not on file  Occupational History  . Not on file  Social Needs  . Financial resource strain: Not on file  . Food insecurity:    Worry: Not on file    Inability: Not on file  . Transportation needs:    Medical: Not on file    Non-medical: Not on file    Tobacco Use  . Smoking status: Never Smoker  . Smokeless tobacco: Never Used  Substance and Sexual Activity  . Alcohol use: No  . Drug use: No  . Sexual activity: Never  Lifestyle  . Physical activity:    Days per week: Not on file    Minutes per session: Not on file  . Stress: Not on file  Relationships  . Social connections:    Talks on phone: Not on file    Gets together: Not on file    Attends religious service: Not on file    Active member of club or organization: Not on file    Attends meetings of clubs or organizations: Not on file    Relationship status: Not on file  Other Topics Concern  . 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   He is currently employed with [employer].   He lives with his grandparents.    He enjoys playing the Xbox and working.     Vitals:   05/01/18 1126  BP: 124/80  Pulse: 81  Resp: 12  Temp: 97.8 F (36.6 C)  SpO2: 99%   Body mass index is 19.05 kg/m.   Physical Exam  Nursing note and vitals reviewed. Constitutional: He is oriented to person, place, and time. He appears well-developed and well-nourished. He does not appear ill. No distress.  HENT:  Head: Normocephalic and atraumatic.  Right Ear: Tympanic membrane, external ear and ear canal normal.  Left Ear: Tympanic membrane, external ear and ear canal normal.  Nose: Rhinorrhea present. Right sinus exhibits no maxillary sinus tenderness and no frontal sinus tenderness. Left sinus exhibits no maxillary sinus tenderness and no frontal sinus tenderness.  Mouth/Throat: Uvula is midline and mucous membranes are normal. Posterior oropharyngeal erythema (minimal) present. No oropharyngeal exudate or posterior oropharyngeal edema.  Eyes: Conjunctivae and EOM are normal.  Cardiovascular: Normal rate and regular rhythm.  No murmur heard. Respiratory: Effort normal and breath sounds normal. No stridor. No respiratory distress.  Here in the  office he has a couple of episodes of nonproductive cough.  Musculoskeletal:       Cervical back: He exhibits no tenderness and no bony tenderness.  Lymphadenopathy:    He has no cervical adenopathy.  Neurological: He is alert and oriented to person, place, and time. He has normal strength. No cranial nerve deficit. Gait normal.  Skin: Skin is warm. No rash noted. No erythema.  Psychiatric: He has a normal mood and affect.  Well groomed, good eye contact.      ASSESSMENT AND PLAN:   Ronald Rivera was seen today for cough and nasal congestion.  Diagnoses and all orders for this visit:  URI, acute Most likely viral etiology. Because symptoms just started yesterday instructed to monitor for new associated symptoms. Rapid flu here in the office negative. Symptomatic treatment recommended for now. Instructed about warning signs.  -     fluticasone (FLONASE) 50 MCG/ACT nasal spray; Place 1 spray into both nostrils 2 (two) times daily. -     POC Influenza  A/B  Cough Explained that cough and congestion can last a couple weeks. Symptomatic treatment with benzonatate. Adequate hydration. I do not think imaging is needed at this time. Follow-up as needed.  -     benzonatate (TESSALON) 100 MG capsule; Take 2 capsules (200 mg total) by mouth 2 (two) times daily as needed for up to 10 days. -     POC Influenza A/B  Headache, unspecified headache type States that he has had similar headache in the past. History does not suggest a serious process. Today no neurologic abnormality appreciated. Instructed about warning signs.        G. [place], MD  [facility]. [place] office.

## 2018-05-07 ENCOUNTER — Other Ambulatory Visit: Payer: Self-pay | Admitting: *Deleted

## 2018-05-07 DIAGNOSIS — J069 Acute upper respiratory infection, unspecified: Secondary | ICD-10-CM

## 2018-05-07 MED ORDER — FLUTICASONE PROPIONATE 50 MCG/ACT NA SUSP: 1.0000 | Freq: Two times a day (BID) | NASAL | 0 refills | Status: DC

## 2018-05-25 ENCOUNTER — Telehealth (INDEPENDENT_AMBULATORY_CARE_PROVIDER_SITE_OTHER): Payer: Self-pay | Admitting: Pediatrics

## 2018-05-25 DIAGNOSIS — F902 Attention-deficit hyperactivity disorder, combined type: Secondary | ICD-10-CM

## 2018-05-25 MED ORDER — AMPHETAMINE-DEXTROAMPHETAMINE 20 MG PO TABS: ORAL_TABLET | ORAL | 0 refills | Status: DC

## 2018-05-25 NOTE — Telephone Encounter (Signed)
Prescription written and sent electronically.

## 2018-05-25 NOTE — Telephone Encounter (Signed)
°  Who's calling (name and relationship to patient) : Malachi BondsGloria [facility]  Best contact number: [phone] Provider they see: Dr. Sharene SkeansHickling  Reason for call: Malachi BondsGloria lvm stating that pt needs [facility] [address]

## 2018-05-25 NOTE — Telephone Encounter (Signed)
Please send medication to pharmacy

## 2018-06-19 NOTE — Progress Notes (Signed)
Chief Complaint  Patient presents with  . Cough    x68months started in november. used otc sudafed and robatussion. Has had blood coughed up one time. Productive cough that is greenish yellow mucus.    HPI: Ronald Rivera 23 y.o. come in for sda new problem s ongoing  Here with gm . On going sx for a month or so  Congestion   No fever   4 days alter  Ear and   amoxicilling ? Agumenting?  ? 10 days   About early november.   Hard to sleep much at night. Cough and congestion   Nasal drainage  Works ups  Not in truck ROS: See pertinent positives and negatives per HPI.  Left shoulder sore  after a life but   Normal function   Past Medical History:  Diagnosis Date  . ADHD (attention deficit hyperactivity disorder)   . Autistic spectrum disorder    sees Dr Sharene Skeans  . Hx of varicella   . Left acute otitis media 03/12/2012  . STREPTOCOCCAL PHARYNGITIS 04/03/2010   Qualifier: Diagnosis of  By: Fabian Sharp MD, Neta Mends   . Tics of organic origin    hx of    Family History  Problem Relation Age of Onset  . Cancer Paternal Grandmother        Died in her 53's    Social History   Socioeconomic History  . Marital status: Single    Spouse name: Not on file  . Number of children: Not on file  . Years of education: Not on file  . Highest education level: Not on file  Occupational History  . Not on file  Social Needs  . Financial resource strain: Not on file  . Food insecurity:    Worry: Not on file    Inability: Not on file  . Transportation needs:    Medical: Not on file    Non-medical: Not on file  Tobacco Use  . Smoking status: Never Smoker  . Smokeless tobacco: Never Used  Substance and Sexual Activity  . Alcohol use: No  . Drug use: No  . Sexual activity: Never  Lifestyle  . Physical activity:    Days per week: Not on file    Minutes per session: Not on file  . Stress: Not on file  Relationships  . Social connections:    Talks on phone: Not on file    Gets  together: Not on file    Attends religious service: Not on file    Active member of club or organization: Not on file    Attends meetings of clubs or organizations: Not on file    Relationship status: Not on file  Other Topics Concern  . 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   He is currently employed with [employer].   He lives with his grandparents.    He enjoys playing the Xbox and working.     Outpatient Medications Prior to Visit  Medication Sig Dispense Refill  . acetaminophen (TYLENOL) 500 MG tablet Take 1,000 mg by mouth every 6 (six) hours as needed for headache.    . cloNIDine (CATAPRES) 0.1 MG tablet TAKE 1/2 TABLET IN THE MORNING 45 tablet 2  . amphetamine-dextroamphetamine (ADDERALL) 20 MG tablet Take 1 tablet daily 31 tablet 0  . fluticasone (FLONASE) 50 MCG/ACT nasal spray Place 1 spray into both nostrils 2 (two) times daily. (Patient not taking: Reported on  06/22/2018) 48 g 0   No facility-administered medications prior to visit.      EXAM:  BP 126/70 (BP Location: Right Arm, Patient Position: Sitting, Cuff Size: Normal)   Pulse 91   Temp 98.2 F (36.8 C) (Oral)   Wt 130 lb 1.6 oz (59 kg)   SpO2 97%   BMI 19.49 kg/m   Body mass index is 19.49 kg/m. WDWN in NAD  quiet respirations; very congested  somewhat hoarse. Non toxic . ocass cough snorting  Nasal  HEENT: Normocephalic ;atraumatic , Eyes;  PERRL, EOMs  Full, lids and conjunctiva clear,,Ears: no deformities, canals nl, TM landmarks normal, Nose: no deformity or discharge butvery  congested;face non tender Mouth : OP clear without lesion or edema . Neck: Supple without adenopathy or masses or bruits Chest:  Clear to A&P without wheezes rales or rhonchi CV:  S1-S2 no gallops or murmurs peripheral perfusion is normal Skin :nl perfusion and no acute rashes  Left shoulder full rom  Area of sroenss anterior rc area but normal rom and strength  G Lab Results    Component Value Date   WBC 9.5 09/27/2014   HGB 15.5 09/27/2014   HCT 46.0 09/27/2014   PLT 351.0 09/27/2014   GLUCOSE 191 (H) 09/13/2014   CHOL 140 09/27/2014   TRIG 83.0 09/27/2014   HDL 35.50 (L) 09/27/2014   LDLCALC 88 09/27/2014   ALT 16 09/13/2014   AST 25 09/13/2014   NA 138 09/13/2014   K 4.4 09/13/2014   CL 102 09/13/2014   CREATININE 1.10 09/13/2014   BUN 20 09/13/2014   CO2 23 09/13/2014   BP Readings from Last 3 Encounters:  06/22/18 126/70  05/01/18 124/80  10/24/17 130/80    ASSESSMENT AND PLAN:  Discussed the following assessment and plan:  Sinusitis, unspecified chronicity, unspecified location  Cough, persistent  Shoulder strain, left, initial encounter  -Patient advised to return or notify health care team  if  new concerns arise.  Patient Instructions  I think your have  An ongoing   Sinus infection  Saline and  Antibiotic     And    Ice to shoulder  .  Left   And  Technique  .   Mild  shoulder strain .   If not improved in   7-10 days or fever etc then contact us for follow up.     Sinusitis, Adult Sinusitis is inflammation of your sinuses. Sinuses are hollow spaces in the bones around your face. Your sinuses are located:  Around your eyes.  In the middle of your forehead.  Behind your nose.  In your cheekbones. Mucus normally drains out of your sinuses. When your nasal tissues become inflamed or swollen, mucus can become trapped or blocked. This allows bacteria, viruses, and fungi to grow, which leads to infection. Most infections of the sinuses are caused by a virus. Sinusitis can develop quickly. It can last for up to 4 weeks (acute) or for more than 12 weeks (chronic). Sinusitis often develops after a cold. What are the causes? This condition is caused by anything that creates swelling in the sinuses or stops mucus from draining. This includes:  Allergies.  Asthma.  Infection from bacteria or viruses.  Deformities or  blockages in your nose or sinuses.  Abnormal growths in the nose (nasal polyps).  Pollutants, such as chemicals or irritants in the air.  Infection from fungi (rare). What increases the risk? You are more likely to develop this condition  if you:  Have a weak body defense system (immune system).  Do a lot of swimming or diving.  Overuse nasal sprays.  Smoke. What are the signs or symptoms? The main symptoms of this condition are pain and a feeling of pressure around the affected sinuses. Other symptoms include:  Stuffy nose or congestion.  Thick drainage from your nose.  Swelling and warmth over the affected sinuses.  Headache.  Upper toothache.  A cough that may get worse at night.  Extra mucus that collects in the throat or the back of the nose (postnasal drip).  Decreased sense of smell and taste.  Fatigue.  A fever.  Sore throat.  Bad breath. How is this diagnosed? This condition is diagnosed based on:  Your symptoms.  Your medical history.  A physical exam.  Tests to find out if your condition is acute or chronic. This may include: ? Checking your nose for nasal polyps. ? Viewing your sinuses using a device that has a light (endoscope). ? Testing for allergies or bacteria. ? Imaging tests, such as an MRI or CT scan. In rare cases, a bone biopsy may be done to rule out more serious types of fungal sinus disease. How is this treated? Treatment for sinusitis depends on the cause and whether your condition is chronic or acute.  If caused by a virus, your symptoms should go away on their own within 10 days. You may be given medicines to relieve symptoms. They include: ? Medicines that shrink swollen nasal passages (topical intranasal decongestants). ? Medicines that treat allergies (antihistamines). ? A spray that eases inflammation of the nostrils (topical intranasal corticosteroids). ? Rinses that help get rid of thick mucus in your nose (nasal saline  washes).  If caused by bacteria, your health care provider may recommend waiting to see if your symptoms improve. Most bacterial infections will get better without antibiotic medicine. You may be given antibiotics if you have: ? A severe infection. ? A weak immune system.  If caused by narrow nasal passages or nasal polyps, you may need to have surgery. Follow these instructions at home: Medicines  Take, use, or apply over-the-counter and prescription medicines only as told by your health care provider. These may include nasal sprays.  If you were prescribed an antibiotic medicine, take it as told by your health care provider. Do not stop taking the antibiotic even if you start to feel better. Hydrate and humidify   Drink enough fluid to keep your urine pale yellow. Staying hydrated will help to thin your mucus.  Use a cool mist humidifier to keep the humidity level in your home above 50%.  Inhale steam for 10-15 minutes, 3-4 times a day, or as told by your health care provider. You can do this in the bathroom while a hot shower is running.  Limit your exposure to cool or dry air. Rest  Rest as much as possible.  Sleep with your head raised (elevated).  Make sure you get enough sleep each night. General instructions   Apply a warm, moist washcloth to your face 3-4 times a day or as told by your health care provider. This will help with discomfort.  Wash your hands often with soap and water to reduce your exposure to germs. If soap and water are not available, use hand sanitizer.  Do not smoke. Avoid being around people who are smoking (secondhand smoke).  Keep all follow-up visits as told by your health care provider. This is important. Contact  a health care provider if:  You have a fever.  Your symptoms get worse.  Your symptoms do not improve within 10 days. Get help right away if:  You have a severe headache.  You have persistent vomiting.  You have severe pain  or swelling around your face or eyes.  You have vision problems.  You develop confusion.  Your neck is stiff.  You have trouble breathing. Summary  Sinusitis is soreness and inflammation of your sinuses. Sinuses are hollow spaces in the bones around your face.  This condition is caused by nasal tissues that become inflamed or swollen. The swelling traps or blocks the flow of mucus. This allows bacteria, viruses, and fungi to grow, which leads to infection.  If you were prescribed an antibiotic medicine, take it as told by your health care provider. Do not stop taking the antibiotic even if you start to feel better.  Keep all follow-up visits as told by your health care provider. This is important. This information is not intended to replace advice given to you by your health care provider. Make sure you discuss any questions you have with your health care provider. Document Released: 06/03/2005 Document Revised: 11/03/2017 Document Reviewed: 11/03/2017 Elsevier Interactive Patient Education  2019 [employer]            KalokoWanda K. Panosh M.D.

## 2018-06-22 ENCOUNTER — Ambulatory Visit: Payer: BLUE CROSS/BLUE SHIELD | Admitting: Internal Medicine

## 2018-06-22 VITALS — BP 126/70 | HR 91 | Temp 98.2°F | Wt 130.1 lb

## 2018-06-22 DIAGNOSIS — R05 Cough: Secondary | ICD-10-CM

## 2018-06-22 DIAGNOSIS — J329 Chronic sinusitis, unspecified: Secondary | ICD-10-CM

## 2018-06-22 DIAGNOSIS — S46912A Strain of unspecified muscle, fascia and tendon at shoulder and upper arm level, left arm, initial encounter: Secondary | ICD-10-CM

## 2018-06-22 DIAGNOSIS — R053 Chronic cough: Secondary | ICD-10-CM

## 2018-06-22 MED ORDER — AMOXICILLIN-POT CLAVULANATE 875-125 MG PO TABS: 1.0000 | ORAL_TABLET | Freq: Two times a day (BID) | ORAL | 0 refills | Status: DC

## 2018-06-22 NOTE — Patient Instructions (Addendum)
I think your have  An ongoing   Sinus infection  Saline and  Antibiotic     And    Ice to shoulder  .  Left   And  Technique  .   Mild  shoulder strain .   If not improved in   7-10 days or fever etc then contact us for follow up.     Sinusitis, Adult Sinusitis is inflammation of your sinuses. Sinuses are hollow spaces in the bones around your face. Your sinuses are located:  Around your eyes.  In the middle of your forehead.  Behind your nose.  In your cheekbones. Mucus normally drains out of your sinuses. When your nasal tissues become inflamed or swollen, mucus can become trapped or blocked. This allows bacteria, viruses, and fungi to grow, which leads to infection. Most infections of the sinuses are caused by a virus. Sinusitis can develop quickly. It can last for up to 4 weeks (acute) or for more than 12 weeks (chronic). Sinusitis often develops after a cold. What are the causes? This condition is caused by anything that creates swelling in the sinuses or stops mucus from draining. This includes:  Allergies.  Asthma.  Infection from bacteria or viruses.  Deformities or blockages in your nose or sinuses.  Abnormal growths in the nose (nasal polyps).  Pollutants, such as chemicals or irritants in the air.  Infection from fungi (rare). What increases the risk? You are more likely to develop this condition if you:  Have a weak body defense system (immune system).  Do a lot of swimming or diving.  Overuse nasal sprays.  Smoke. What are the signs or symptoms? The main symptoms of this condition are pain and a feeling of pressure around the affected sinuses. Other symptoms include:  Stuffy nose or congestion.  Thick drainage from your nose.  Swelling and warmth over the affected sinuses.  Headache.  Upper toothache.  A cough that may get worse at night.  Extra mucus that collects in the throat or the back of the nose (postnasal drip).  Decreased sense of  smell and taste.  Fatigue.  A fever.  Sore throat.  Bad breath. How is this diagnosed? This condition is diagnosed based on:  Your symptoms.  Your medical history.  A physical exam.  Tests to find out if your condition is acute or chronic. This may include: ? Checking your nose for nasal polyps. ? Viewing your sinuses using a device that has a light (endoscope). ? Testing for allergies or bacteria. ? Imaging tests, such as an MRI or CT scan. In rare cases, a bone biopsy may be done to rule out more serious types of fungal sinus disease. How is this treated? Treatment for sinusitis depends on the cause and whether your condition is chronic or acute.  If caused by a virus, your symptoms should go away on their own within 10 days. You may be given medicines to relieve symptoms. They include: ? Medicines that shrink swollen nasal passages (topical intranasal decongestants). ? Medicines that treat allergies (antihistamines). ? A spray that eases inflammation of the nostrils (topical intranasal corticosteroids). ? Rinses that help get rid of thick mucus in your nose (nasal saline washes).  If caused by bacteria, your health care provider may recommend waiting to see if your symptoms improve. Most bacterial infections will get better without antibiotic medicine. You may be given antibiotics if you have: ? A severe infection. ? A weak immune system.  If caused  by narrow nasal passages or nasal polyps, you may need to have surgery. Follow these instructions at home: Medicines  Take, use, or apply over-the-counter and prescription medicines only as told by your health care provider. These may include nasal sprays.  If you were prescribed an antibiotic medicine, take it as told by your health care provider. Do not stop taking the antibiotic even if you start to feel better. Hydrate and humidify   Drink enough fluid to keep your urine pale yellow. Staying hydrated will help to thin  your mucus.  Use a cool mist humidifier to keep the humidity level in your home above 50%.  Inhale steam for 10-15 minutes, 3-4 times a day, or as told by your health care provider. You can do this in the bathroom while a hot shower is running.  Limit your exposure to cool or dry air. Rest  Rest as much as possible.  Sleep with your head raised (elevated).  Make sure you get enough sleep each night. General instructions   Apply a warm, moist washcloth to your face 3-4 times a day or as told by your health care provider. This will help with discomfort.  Wash your hands often with soap and water to reduce your exposure to germs. If soap and water are not available, use hand sanitizer.  Do not smoke. Avoid being around people who are smoking (secondhand smoke).  Keep all follow-up visits as told by your health care provider. This is important. Contact a health care provider if:  You have a fever.  Your symptoms get worse.  Your symptoms do not improve within 10 days. Get help right away if:  You have a severe headache.  You have persistent vomiting.  You have severe pain or swelling around your face or eyes.  You have vision problems.  You develop confusion.  Your neck is stiff.  You have trouble breathing. Summary  Sinusitis is soreness and inflammation of your sinuses. Sinuses are hollow spaces in the bones around your face.  This condition is caused by nasal tissues that become inflamed or swollen. The swelling traps or blocks the flow of mucus. This allows bacteria, viruses, and fungi to grow, which leads to infection.  If you were prescribed an antibiotic medicine, take it as told by your health care provider. Do not stop taking the antibiotic even if you start to feel better.  Keep all follow-up visits as told by your health care provider. This is important. This information is not intended to replace advice given to you by your health care provider. Make  sure you discuss any questions you have with your health care provider. Document Released: 06/03/2005 Document Revised: 11/03/2017 Document Reviewed: 11/03/2017 Elsevier Interactive Patient Education  2019 [employer]

## 2018-06-23 ENCOUNTER — Other Ambulatory Visit (INDEPENDENT_AMBULATORY_CARE_PROVIDER_SITE_OTHER): Payer: Self-pay | Admitting: Pediatrics

## 2018-06-23 DIAGNOSIS — F902 Attention-deficit hyperactivity disorder, combined type: Secondary | ICD-10-CM

## 2018-06-23 NOTE — Telephone Encounter (Signed)
Please send to pharmacy.

## 2018-06-23 NOTE — Telephone Encounter (Signed)
°  Who's calling (name and relationship to patient) : Berle Mull, grandmother  Best contact number: [phone]  Provider they see: Dr. Sharene Skeans  Reason for call: Medication refill for Adderall     PRESCRIPTION REFILL ONLY  Name of prescription: Adderall 20 mg tablet  Pharmacy: [id number] [place], [place] -[address]

## 2018-06-24 ENCOUNTER — Encounter: Payer: Self-pay | Admitting: Internal Medicine

## 2018-06-24 MED ORDER — AMPHETAMINE-DEXTROAMPHETAMINE 20 MG PO TABS: ORAL_TABLET | ORAL | 0 refills | Status: DC

## 2018-07-03 ENCOUNTER — Ambulatory Visit: Payer: BLUE CROSS/BLUE SHIELD | Admitting: Internal Medicine

## 2018-07-16 NOTE — Progress Notes (Signed)
Chief Complaint  Patient presents with  . Annual Exam    Pt still has cough from last ov been on claritin for a week . Pt has no other concerns today     HPI: Patient  Ronald Rivera  23 y.o. comes in today for Preventive Health Care visit  Here with  Gm parent  Doing well x  Intermittent cough   And stuffy nose just started Claritin no fever .  No sob  Seems to be less at work   Sees dr Gaynell Face yearly not taking adderall on weekends seems ok    Health Maintenance  Topic Date Due  . HIV Screening  11/22/2008  . INFLUENZA VACCINE  09/15/2018 (Originally 01/15/2018)  . TETANUS/TDAP  04/19/2027   Health Maintenance Review LIFESTYLE:  Exercise:    Physical at work, Tobacco/ETS: no  Alcohol: no Sugar beverages:  Sprite water sweet tea.  Sleep: ok Drug use: no HH of 4   1 dog  Work:   20 - 25   ROS:  GEN/ HEENT: No fever, significant weight changes sweats headaches vision problems hearing changes, CV/ PULM; No chest pain shortness of breath  syncope,edema  change in exercise tolerance. GI /GU: No adominal pain, vomiting, change in bowel habits. No blood in the stool. No significant GU symptoms. SKIN/HEME: ,no acute skin rashes suspicious lesions or bleeding. No lymphadenopathy, nodules, masses.  NEURO/ PSYCH:  No neurologic signs such as weakness numbness. No depression anxiety. IMM/ Allergy: No unusual infections.  Allergy .   REST of 12 system review negative except as per HPI   Past Medical History:  Diagnosis Date  . ADHD (attention deficit hyperactivity disorder)   . Autistic spectrum disorder    sees Dr Gaynell Face  . Hx of varicella   . Left acute otitis media 03/12/2012  . STREPTOCOCCAL PHARYNGITIS 04/03/2010   Qualifier: Diagnosis of  By: Regis Bill MD, Standley Brooking   . Tics of organic origin    hx of    No past surgical history on file.  Family History  Problem Relation Age of Onset  . Cancer Paternal Grandmother        Died in her 71's    Social History    Socioeconomic History  . Marital status: Single    Spouse name: Not on file  . Number of children: Not on file  . Years of education: Not on file  . Highest education level: Not on file  Occupational History  . Not on file  Social Needs  . Financial resource strain: Not on file  . Food insecurity:    Worry: Not on file    Inability: Not on file  . Transportation needs:    Medical: Not on file    Non-medical: Not on file  Tobacco Use  . Smoking status: Never Smoker  . Smokeless tobacco: Never Used  Substance and Sexual Activity  . Alcohol use: No  . Drug use: No  . Sexual activity: Never  Lifestyle  . Physical activity:    Days per week: Not on file    Minutes per session: Not on file  . Stress: Not on file  Relationships  . Social connections:    Talks on phone: Not on file    Gets together: Not on file    Attends religious service: Not on file    Active member of club or organization: Not on file    Attends meetings of clubs or organizations: Not on file  Relationship status: Not on file  Other Topics Concern  . 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   He is currently employed with [employer].   He lives with his grandparents.    He enjoys playing the Xbox and working.     Outpatient Medications Prior to Visit  Medication Sig Dispense Refill  . acetaminophen (TYLENOL) 500 MG tablet Take 1,000 mg by mouth every 6 (six) hours as needed for headache.    Marland Kitchen amoxicillin-clavulanate (AUGMENTIN) 875-125 MG tablet Take 1 tablet by mouth every 12 (twelve) hours. 20 tablet 0  . amphetamine-dextroamphetamine (ADDERALL) 20 MG tablet Take 1 tablet daily 31 tablet 0  . cloNIDine (CATAPRES) 0.1 MG tablet TAKE 1/2 TABLET IN THE MORNING 45 tablet 2  . loratadine (CLARITIN) 10 MG tablet Take 10 mg by mouth daily.    . fluticasone (FLONASE) 50 MCG/ACT nasal spray Place 1 spray into both nostrils 2 (two) times daily. (Patient not  taking: Reported on 06/22/2018) 48 g 0   No facility-administered medications prior to visit.      EXAM:  BP 118/72 (BP Location: Right Arm, Patient Position: Sitting, Cuff Size: Normal)   Pulse 66   Temp 97.7 F (36.5 C) (Oral)   Ht _0  (1.753 m)   Wt 132 lb 9.6 oz (60.1 kg)   BMI 19.58 kg/m   Body mass index is 19.58 kg/m. Wt Readings from Last 3 Encounters:  07/17/18 132 lb 9.6 oz (60.1 kg)  06/22/18 130 lb 1.6 oz (59 kg)  05/01/18 127 lb 2 oz (57.7 kg)    Physical Exam: Vital signs reviewed YTK:PTWS is a well-developed well-nourished alert cooperative    who appearsr stated age in no acute distress.  Mild stuffy nose  And ocass upper irritative cough  HEENT: normocephalic atraumatic , Eyes: PERRL EOM's full, conjunctiva clear, Nares: paten,t no deformity discharge or tenderness., Ears: no deformity EAC's clear TMs with normal landmarks. Mouth: clear OP, no lesions, edema.  Moist mucous membranes. Dentition in adequate repair. NECK: supple without masses, thyromegaly or bruits. CHEST/PULM:  Clear to auscultation and percussion breath sounds equal no wheeze , rales or rhonchi. No chest wall deformities or tenderness.CV: PMI is nondisplaced, S1 S2 no gallops, murmurs, rubs. Peripheral pulses are full without delay.No JVD .  ABDOMEN: Bowel sounds normal nontender  No guard or rebound, no hepato splenomegal no CVA tenderness.  Extremtities:  No clubbing cyanosis or edema, no acute joint swelling or redness no focal atrophy NEURO:  Oriented x3, cranial nerves 3-12 appear to be intact, no obvious focal weakness,gait within normal limits SKIN: No acute rashes normal turgor, color, no bruising or petechiae. PSYCH: Oriented, good eye contact, no obvious depression anxiety, cognition and judgment appear normal. LN: no cervical axillary  adenopathy  Lab Results  Component Value Date   WBC 9.5 09/27/2014   HGB 15.5 09/27/2014   HCT 46.0 09/27/2014   PLT 351.0 09/27/2014   GLUCOSE 191  (H) 09/13/2014   CHOL 140 09/27/2014   TRIG 83.0 09/27/2014   HDL 35.50 (L) 09/27/2014   LDLCALC 88 09/27/2014   ALT 16 09/13/2014   AST 25 09/13/2014   NA 138 09/13/2014   K 4.4 09/13/2014   CL 102 09/13/2014   CREATININE 1.10 09/13/2014   BUN 20 09/13/2014   CO2 23 09/13/2014   HGBA1C 5.3 07/17/2018    BP Readings from Last 3 Encounters:  07/17/18 118/72  06/22/18 126/70  05/01/18 124/80      ASSESSMENT AND PLAN:  Discussed the following assessment and plan:  Visit for preventive health examination  Cough, persistent  Elevated blood sugar - when ill in  ED  2016 fam hx   nl a1c  - Plan: POCT glycosylated hemoglobin (Hb A1C)  Influenza vaccination declined Cough most likely from post nasal drainage or rhinitis    Expectant management.  Declines influenza imm and  Blood work  a1c nl  Patient Care Team: Katora Fini, Standley Brooking, MD as PCP - General Gaynell Face Princess Bruins, MD (Neurology) Patient Instructions  After the claritin  Could try singular in case allergy cough ( would have to take daily for 2 weeks  To see if helps)   Exam is good except stuffy nose . ( saline is good) .  If ongoin  we can get a chest x ray but  Can wiat on this because your lung exam is good   Glad you are doing well. Limit sugar drinks to ONE SERVING per day .    Preventive Care for [place], Male The transition to life after high school as a young adult can be a stressful time with many changes. You may start seeing a primary care physician instead of a pediatrician. This is the time when your health care becomes your responsibility. Preventive care refers to lifestyle choices and visits with your health care provider that can promote health and wellness. What does preventive care include?  A yearly physical exam. This is also called an annual wellness visit.  Dental exams once or twice a year.  Routine eye exams. Ask your health care provider how often you should have your eyes  checked.  Personal lifestyle choices, including: ? Daily care of your teeth and gums. ? Regular physical activity. ? Eating a healthy diet. ? Avoiding tobacco and drug use. ? Avoiding or limiting alcohol use. ? Practicing safe sex. What happens during an annual wellness visit? Preventive care starts with a yearly visit to your primary care physician. The services and screenings done by your health care provider during your annual wellness visit will depend on your overall health, lifestyle risk factors, and family history of disease. Counseling Your health care provider may ask you questions about:  Past medical problems and your family's medical history.  Medicines or supplements that you take.  Health insurance and access to health care.  Alcohol, tobacco, and drug use, including use of any bodybuilding drugs (anabolic steroids).  Your safety at home, work, or school.  Access to firearms.  Emotional well-being and how you cope with stress.  Relationship well-being.  Diet, exercise, and sleep habits.  Your sexual health and activity. Screening You may have the following tests or measurements:  Height, weight, and BMI.  Blood pressure.  Lipid and cholesterol levels.  Tuberculosis skin test.  Skin exam.  Vision and hearing tests.  Genital exam to check for testicular cancer or hernias.  Screening test for hepatitis.  Screening tests for STDs (sexually transmitted diseases), if you are at risk. Vaccines Your health care provider may recommend certain vaccines, such as:  Influenza vaccine. This is recommended every year.  Tetanus, diphtheria, and acellular pertussis (Tdap, Td) vaccine. You may need a Td booster every 10 years.  Varicella vaccine. You may need this if you have not been vaccinated.  HPV vaccine. If you are 63 or younger, you may need three doses over 6 months.  Measles, mumps, and rubella (MMR) vaccine. You  may need at least one dose of MMR.  You may also need a second dose.  Pneumococcal 13-valent conjugate (PCV13) vaccine. You may need this if you have certain conditions and have not been vaccinated.  Pneumococcal polysaccharide (PPSV23) vaccine. You may need one or two doses if you smoke cigarettes or if you have certain conditions.  Meningococcal vaccine. One dose is recommended if you are age 65-21 years and a first-year college student living in a residence hall, or if you have one of several medical conditions. You may also need additional booster doses.  Hepatitis A vaccine. You may need this if you have certain conditions or if you travel or work in places where you may be exposed to hepatitis A.  Hepatitis B vaccine. You may need this if you have certain conditions or if you travel or work in places where you may be exposed to hepatitis B.  Haemophilus influenzae type b (Hib) vaccine. You may need this if you have certain risk factors. Talk to your health care provider about which screenings and vaccines you need and how often you need them. What steps can I take to develop healthy behaviors?      Have regular preventive health care visits with your primary care physician and dentist.  Eat a healthy diet.  Drink enough fluid to keep your urine clear or pale yellow.  Stay active. Exercise at least 30 minutes 5 or more days of the week.  Use alcohol responsibly.  Maintain a healthy weight.  Do not use any products that contain nicotine, such as cigarettes, chewing tobacco, and e-cigarettes. If you need help quitting, ask your health care provider.  Do not use drugs.  Practice safe sex. This includes using condoms to prevent STDs or an unwanted pregnancy.  Find healthy ways to manage stress. How can I protect myself from injury? Injuries from violence or accidents are the leading cause of death among young adults and can often be prevented. Take these steps to help protect yourself:  Always wear your seat  belt while driving or riding in a vehicle.  Do not drive if you have been drinking alcohol. Do not ride with someone who has been drinking.  Do not drive when you are tired or distracted. Do not text while driving.  Wear a helmet and other protective equipment during sports activities.  If you have firearms in your house, make sure you follow all gun safety procedures.  Seek help if you have been bullied, physically abused, or sexually abused.  Avoid fighting.  Use the Internet responsibly to avoid dangers such as online bullying. What can I do to cope with stress? Young adults may face many new challenges that can be stressful, such as finding a job, going to college, moving away from home, managing money, being in a relationship, getting married, and having children. To manage stress:  Avoid known stressful situations when you can.  Exercise regularly.  Find a stress-reducing activity that works best for you. Examples include meditation, yoga, listening to music, or reading.  Spend time in nature.  Keep a journal to write about your stress and how you respond.  Talk to your health care provider about stress. He or she may suggest counseling.  Spend time with supportive friends or family.  Do not cope with stress by: ? Drinking alcohol or using drugs. ? Smoking cigarettes. ? Eating. Where can I get more information? Learn more about preventive care and healthy habits from:  U.S. Preventive  Services Task Force: StageSync.si  National Adolescent and [place]: StrategicRoad.nl  American Academy of Pediatrics Bright Futures: https://brightfutures.MemberVerification.co.za  Society for Adolescent Health and Medicine: MoralBlog.co.za.aspx  PodExchange.nl:  ToyLending.fr This information is not intended to replace advice given to you by your health care provider. Make sure you discuss any questions you have with your health care provider. Document Released: 10/19/2015 Document Revised: 01/14/2017 Document Reviewed: 10/19/2015 Elsevier Interactive Patient Education  2019 Eva K. Lenzie Sandler M.D.

## 2018-07-17 ENCOUNTER — Encounter: Payer: Self-pay | Admitting: Internal Medicine

## 2018-07-17 ENCOUNTER — Ambulatory Visit (INDEPENDENT_AMBULATORY_CARE_PROVIDER_SITE_OTHER): Payer: BLUE CROSS/BLUE SHIELD | Admitting: Internal Medicine

## 2018-07-17 VITALS — BP 118/72 | HR 66 | Temp 97.7°F | Ht 69.0 in | Wt 132.6 lb

## 2018-07-17 DIAGNOSIS — R05 Cough: Secondary | ICD-10-CM

## 2018-07-17 DIAGNOSIS — Z0000 Encounter for general adult medical examination without abnormal findings: Secondary | ICD-10-CM

## 2018-07-17 DIAGNOSIS — Z2821 Immunization not carried out because of patient refusal: Secondary | ICD-10-CM | POA: Diagnosis not present

## 2018-07-17 DIAGNOSIS — R739 Hyperglycemia, unspecified: Secondary | ICD-10-CM

## 2018-07-17 DIAGNOSIS — R053 Chronic cough: Secondary | ICD-10-CM

## 2018-07-17 LAB — POCT GLYCOSYLATED HEMOGLOBIN (HGB A1C): Hemoglobin A1C: 5.3 % (ref 4.0–5.6)

## 2018-07-17 NOTE — Patient Instructions (Addendum)
After the claritin  Could try singular in case allergy cough ( would have to take daily for 2 weeks  To see if helps)   Exam is good except stuffy nose . ( saline is good) .  If ongoin  we can get a chest x ray but  Can wiat on this because your lung exam is good   Glad you are doing well. Limit sugar drinks to ONE SERVING per day .    Preventive Care for [place], Male The transition to life after high school as a young adult can be a stressful time with many changes. You may start seeing a primary care physician instead of a pediatrician. This is the time when your health care becomes your responsibility. Preventive care refers to lifestyle choices and visits with your health care provider that can promote health and wellness. What does preventive care include?  A yearly physical exam. This is also called an annual wellness visit.  Dental exams once or twice a year.  Routine eye exams. Ask your health care provider how often you should have your eyes checked.  Personal lifestyle choices, including: ? Daily care of your teeth and gums. ? Regular physical activity. ? Eating a healthy diet. ? Avoiding tobacco and drug use. ? Avoiding or limiting alcohol use. ? Practicing safe sex. What happens during an annual wellness visit? Preventive care starts with a yearly visit to your primary care physician. The services and screenings done by your health care provider during your annual wellness visit will depend on your overall health, lifestyle risk factors, and family history of disease. Counseling Your health care provider may ask you questions about:  Past medical problems and your family's medical history.  Medicines or supplements that you take.  Health insurance and access to health care.  Alcohol, tobacco, and drug use, including use of any bodybuilding drugs (anabolic steroids).  Your safety at home, work, or school.  Access to firearms.  Emotional well-being and how  you cope with stress.  Relationship well-being.  Diet, exercise, and sleep habits.  Your sexual health and activity. Screening You may have the following tests or measurements:  Height, weight, and BMI.  Blood pressure.  Lipid and cholesterol levels.  Tuberculosis skin test.  Skin exam.  Vision and hearing tests.  Genital exam to check for testicular cancer or hernias.  Screening test for hepatitis.  Screening tests for STDs (sexually transmitted diseases), if you are at risk. Vaccines Your health care provider may recommend certain vaccines, such as:  Influenza vaccine. This is recommended every year.  Tetanus, diphtheria, and acellular pertussis (Tdap, Td) vaccine. You may need a Td booster every 10 years.  Varicella vaccine. You may need this if you have not been vaccinated.  HPV vaccine. If you are 91 or younger, you may need three doses over 6 months.  Measles, mumps, and rubella (MMR) vaccine. You may need at least one dose of MMR. You may also need a second dose.  Pneumococcal 13-valent conjugate (PCV13) vaccine. You may need this if you have certain conditions and have not been vaccinated.  Pneumococcal polysaccharide (PPSV23) vaccine. You may need one or two doses if you smoke cigarettes or if you have certain conditions.  Meningococcal vaccine. One dose is recommended if you are age 67-21 years and a first-year college student living in a residence hall, or if you have one of several medical conditions. You may also need additional booster doses.  Hepatitis A vaccine. You  may need this if you have certain conditions or if you travel or work in places where you may be exposed to hepatitis A.  Hepatitis B vaccine. You may need this if you have certain conditions or if you travel or work in places where you may be exposed to hepatitis B.  Haemophilus influenzae type b (Hib) vaccine. You may need this if you have certain risk factors. Talk to your health care  provider about which screenings and vaccines you need and how often you need them. What steps can I take to develop healthy behaviors?      Have regular preventive health care visits with your primary care physician and dentist.  Eat a healthy diet.  Drink enough fluid to keep your urine clear or pale yellow.  Stay active. Exercise at least 30 minutes 5 or more days of the week.  Use alcohol responsibly.  Maintain a healthy weight.  Do not use any products that contain nicotine, such as cigarettes, chewing tobacco, and e-cigarettes. If you need help quitting, ask your health care provider.  Do not use drugs.  Practice safe sex. This includes using condoms to prevent STDs or an unwanted pregnancy.  Find healthy ways to manage stress. How can I protect myself from injury? Injuries from violence or accidents are the leading cause of death among young adults and can often be prevented. Take these steps to help protect yourself:  Always wear your seat belt while driving or riding in a vehicle.  Do not drive if you have been drinking alcohol. Do not ride with someone who has been drinking.  Do not drive when you are tired or distracted. Do not text while driving.  Wear a helmet and other protective equipment during sports activities.  If you have firearms in your house, make sure you follow all gun safety procedures.  Seek help if you have been bullied, physically abused, or sexually abused.  Avoid fighting.  Use the Internet responsibly to avoid dangers such as online bullying. What can I do to cope with stress? Young adults may face many new challenges that can be stressful, such as finding a job, going to college, moving away from home, managing money, being in a relationship, getting married, and having children. To manage stress:  Avoid known stressful situations when you can.  Exercise regularly.  Find a stress-reducing activity that works best for you. Examples  include meditation, yoga, listening to music, or reading.  Spend time in nature.  Keep a journal to write about your stress and how you respond.  Talk to your health care provider about stress. He or she may suggest counseling.  Spend time with supportive friends or family.  Do not cope with stress by: ? Drinking alcohol or using drugs. ? Smoking cigarettes. ? Eating. Where can I get more information? Learn more about preventive care and healthy habits from:  U.S. Preventive Services Task Force: StageSync.si  National Adolescent and [place]: StrategicRoad.nl  American Academy of Pediatrics Bright Futures: https://brightfutures.MemberVerification.co.za  Society for Adolescent Health and Medicine: MoralBlog.co.za.aspx  PodExchange.nl: ToyLending.fr This information is not intended to replace advice given to you by your health care provider. Make sure you discuss any questions you have with your health care provider. Document Released: 10/19/2015 Document Revised: 01/14/2017 Document Reviewed: 10/19/2015 Elsevier Interactive Patient Education  2019 [employer].

## 2018-07-23 ENCOUNTER — Telehealth (INDEPENDENT_AMBULATORY_CARE_PROVIDER_SITE_OTHER): Payer: Self-pay | Admitting: Pediatrics

## 2018-07-23 DIAGNOSIS — F902 Attention-deficit hyperactivity disorder, combined type: Secondary | ICD-10-CM

## 2018-07-23 MED ORDER — AMPHETAMINE-DEXTROAMPHETAMINE 20 MG PO TABS: ORAL_TABLET | ORAL | 0 refills | Status: DC

## 2018-07-23 NOTE — Telephone Encounter (Signed)
°  Who's calling (name and relationship to patient) : Wilhemena Durie contact number: [phone] Provider they see: Sharene Skeans Reason for call:     PRESCRIPTION REFILL ONLY  Name of prescription: Adderall 20mg  [facility] [address]

## 2018-07-23 NOTE — Telephone Encounter (Signed)
Done

## 2018-08-24 ENCOUNTER — Telehealth (INDEPENDENT_AMBULATORY_CARE_PROVIDER_SITE_OTHER): Payer: Self-pay | Admitting: Pediatrics

## 2018-08-24 DIAGNOSIS — F902 Attention-deficit hyperactivity disorder, combined type: Secondary | ICD-10-CM

## 2018-08-24 MED ORDER — AMPHETAMINE-DEXTROAMPHETAMINE 20 MG PO TABS: ORAL_TABLET | ORAL | 0 refills | Status: DC

## 2018-08-24 NOTE — Telephone Encounter (Signed)
Prescription has been electronically sent. 

## 2018-08-24 NOTE — Telephone Encounter (Signed)
Please send to the pharmacy °

## 2018-08-24 NOTE — Telephone Encounter (Signed)
°  Who's calling (name and relationship to patient) : [name] [facility]) Best contact number: [phone] Provider they see: Dr. Sharene Skeans  Reason for call: [name] requesting refill on pt's Adderall.      PRESCRIPTION REFILL ONLY  Name of prescription: Adderall [facility] [address]

## 2018-09-04 ENCOUNTER — Ambulatory Visit (INDEPENDENT_AMBULATORY_CARE_PROVIDER_SITE_OTHER): Payer: Self-pay | Admitting: Pediatrics

## 2018-09-21 ENCOUNTER — Telehealth (INDEPENDENT_AMBULATORY_CARE_PROVIDER_SITE_OTHER): Payer: Self-pay | Admitting: Pediatrics

## 2018-09-21 DIAGNOSIS — F902 Attention-deficit hyperactivity disorder, combined type: Secondary | ICD-10-CM

## 2018-09-21 MED ORDER — AMPHETAMINE-DEXTROAMPHETAMINE 20 MG PO TABS: ORAL_TABLET | ORAL | 0 refills | Status: DC

## 2018-09-21 NOTE — Telephone Encounter (Signed)
Please send to the pharmacy °

## 2018-09-21 NOTE — Telephone Encounter (Signed)
°  Who's calling (name and relationship to patient) : [name] Gleason - grandmother   Best contact number: [phone]  Provider they see: Dr. Sharene Skeans    Reason for call:  Grandmother called stating Ronald Rivera does need a refill. Will be out by 09/24/18.   PRESCRIPTION REFILL ONLY  Name of prescription: Adderall 20 MG Tablet   [facility] [place]

## 2018-09-21 NOTE — Telephone Encounter (Signed)
Prescription has been refilled.

## 2018-10-20 ENCOUNTER — Telehealth (INDEPENDENT_AMBULATORY_CARE_PROVIDER_SITE_OTHER): Payer: Self-pay | Admitting: Pediatrics

## 2018-10-20 DIAGNOSIS — F902 Attention-deficit hyperactivity disorder, combined type: Secondary | ICD-10-CM

## 2018-10-20 MED ORDER — AMPHETAMINE-DEXTROAMPHETAMINE 20 MG PO TABS: ORAL_TABLET | ORAL | 0 refills | Status: DC

## 2018-10-20 NOTE — Telephone Encounter (Signed)
Who's calling (name and relationship to patient) : [name] Gleason (grandmother, DPR on file)  Best contact number: [phone]  Provider they see: Dr. Sharene Skeans  Reason for call:  Grandmother called in to confirm appt on Friday, also needs a refill for Dashiel's adderral to be sent it. Please advise   Call ID:      PRESCRIPTION REFILL ONLY  Name of prescription: Adderall   [facility] [address]

## 2018-10-20 NOTE — Telephone Encounter (Signed)
Prescription was refilled and printed.  Apparently I could not send it digitally.

## 2018-10-20 NOTE — Telephone Encounter (Signed)
Grandmother called to confirm the appointment on Friday.  I can see and email, but nothing else.  Please respond to her.

## 2018-10-20 NOTE — Telephone Encounter (Signed)
Spoke with grandmother and she was aware of the virtual visit for Friday. She spoke with Wynona Canes this morning. Also informed her that the refill was sent in

## 2018-10-20 NOTE — Addendum Note (Signed)
Addended by: Deetta Perla on: 10/20/2018 12:17 PM   Modules accepted: Orders

## 2018-10-23 ENCOUNTER — Other Ambulatory Visit: Payer: Self-pay

## 2018-10-23 ENCOUNTER — Encounter (INDEPENDENT_AMBULATORY_CARE_PROVIDER_SITE_OTHER): Payer: Self-pay | Admitting: Pediatrics

## 2018-10-23 ENCOUNTER — Ambulatory Visit (INDEPENDENT_AMBULATORY_CARE_PROVIDER_SITE_OTHER): Payer: BLUE CROSS/BLUE SHIELD | Admitting: Pediatrics

## 2018-10-23 VITALS — BP 120/74 | HR 92 | Ht 68.5 in | Wt 126.6 lb

## 2018-10-23 DIAGNOSIS — F902 Attention-deficit hyperactivity disorder, combined type: Secondary | ICD-10-CM

## 2018-10-23 DIAGNOSIS — G2569 Other tics of organic origin: Secondary | ICD-10-CM

## 2018-10-23 DIAGNOSIS — F840 Autistic disorder: Secondary | ICD-10-CM

## 2018-10-23 MED ORDER — CLONIDINE HCL 0.1 MG PO TABS: ORAL_TABLET | ORAL | 3 refills | Status: DC

## 2018-10-23 NOTE — Progress Notes (Signed)
Patient: Ronald Rivera MRN: [id number] Sex: male DOB: 01/11/1996  Provider: Ellison CarwinWilliam Hickling, MD [facility]  Note type: Routine return visit  History of Present Illness: Referral Source: Berniece AndreasWanda Panosh, MD History from: grandmother, patient and [facility] chart Chief Complaint: Tics/Autism/ADHD  Ronald Ronald Rivera is a 23 y.o. male who returns on Oct 23, 2018 for the first time since Oct 24, 2017.  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  He graduated from high school in June 2015.  He is working at [employer] where he has worked now for about 19 months.  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    The success in this young man is a testament to his abilities, but also the support that he has received at [employer].  He is enjoying his work and is gainfully employed.  Interestingly, he told me that the workers are all in Animatorhazmat suits (his words).  I think that this is remarkable because it shows that [employer] is very serious about keeping the health of their employees intact in a business that is without question an essential part of the economy.  The patient has a history of attention deficit disorder and motor tics.  These have been well controlled with neuro-stimulant medication and clonidine.  There is no reason for us to change this.  He also has a history of headaches, but the last headache that he had was about 6 months ago and even that was not a migraine.  He has good sleep hygiene and sleeps well.  He has gained 4-1/2 pounds since he was seen, but he looks well.  When he is not working, he is at home watching TV.  We discussed that he is not allowed to have a phone to work, but this is problematic because if his mother is delayed, she  has no way to communicate with him.  I asked him to speak with his supervisor about whether or not it would be possible for him to bring his phone and give it to the supervisor while he is at work and then pick it up at the end of the day.  He is aware that there are plenty of other employees who sneak their phones to work and hide them for use at the end of the day.  It seems to me that this would be a transparent way to deal with this problem.  Review of Systems: A complete review of systems was assessed and was negative.  Past Medical History Diagnosis Date  . ADHD (attention deficit hyperactivity disorder)   . Autistic spectrum disorder    sees Dr Sharene SkeansHickling  . Hx of varicella   . Left acute otitis media 03/12/2012  . STREPTOCOCCAL PHARYNGITIS 04/03/2010   Qualifier: Diagnosis of  By: Fabian SharpPanosh MD, Neta MendsWanda K   . Tics of organic origin    hx of   Hospitalizations: No., Head Injury: No., Nervous System Infections: No., Immunizations up to date: Yes.    Behavior History Autism spectrum disorder, level 1  Surgical History History reviewed. No pertinent surgical history.  Family History family history includes Cancer in his paternal grandmother. Family history is negative for migraines, seizures, intellectual disabilities, blindness, deafness, birth defects, chromosomal disorder, or  autism.  Social History Social Needs  . Financial resource strain: Not on file  . Food insecurity:    Worry: Not on file    Inability: Not on file  . 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    He is currently employed with [employer].    He lives with his grandparents.     He enjoys playing the Xbox and working.    Allergies Allergen Reactions  . Aripiprazole Other (See Comments)    Causes seizures   Physical Exam BP 120/74   Pulse 92   Ht 5' 8.5" (1.74 m)   Wt 126 lb 9.6 oz (57.4 kg)    BMI 18.97 kg/m   General: alert, well developed, well nourished, in no acute distress, sandy hair, blue eyes, right handed Head: normocephalic, no dysmorphic features Ears, Nose and Throat: Otoscopic: tympanic membranes normal; pharynx: oropharynx is pink without exudates or tonsillar hypertrophy Neck: supple, full range of motion, no cranial or cervical bruits Respiratory: auscultation clear Cardiovascular: no murmurs, pulses are normal Musculoskeletal: no skeletal deformities or apparent scoliosis Skin: no rashes or neurocutaneous lesions  Neurologic Exam  Mental Status: alert; oriented to person, place and year; knowledge is normal for age; language is normal Cranial Nerves: visual fields are full to double simultaneous stimuli; extraocular movements are full and conjugate; pupils are round reactive to light; funduscopic examination shows sharp disc margins with normal vessels; symmetric facial strength; midline tongue and uvula; air conduction is greater than bone conduction bilaterally Motor: Normal strength, tone and mass; good fine motor movements; no pronator drift Sensory: intact responses to cold, vibration, proprioception and stereognosis Coordination: good finger-to-nose, rapid repetitive alternating movements and finger apposition Gait and Station: normal gait and station: patient is able to walk on heels, toes and tandem without difficulty; balance is adequate; Romberg exam is negative; Gower response is negative Reflexes: symmetric and diminished bilaterally; no clonus; bilateral flexor plantar responses  Assessment 1. Attention deficit hyperactivity disorder, combined type, F90.2. 2. Autism spectrum disorder requiring support (level 1), F84.0. 3. Tics of organic origin, G25.69.  Discussion I am very pleased that the patient is doing well in all these areas.  I am particularly proud of how well he has adapted to [employer].  He works about 30 hours a week.  He stays with his  mother.  He is gainfully employed and happy and remains healthy.  Plan I refilled his prescriptions for generic Adderall and clonidine.  Greater than 50% of a 25 minute visit was spent in counseling and coordination of care.  He will return to see me in a year.  I will see him sooner based on clinical need.   Medication List   Accurate as of Oct 23, 2018 11:34 AM. If you have any questions, ask your nurse or doctor.    acetaminophen 500 MG tablet Commonly known as:  TYLENOL Take 1,000 mg by mouth every 6 (six) hours as needed for headache.   amoxicillin-clavulanate 875-125 MG tablet Commonly known as:  Augmentin Take 1 tablet by mouth every 12 (twelve) hours.   amphetamine-dextroamphetamine 20 MG tablet Commonly known as:  Adderall Take 1 tablet daily   cloNIDine 0.1 MG tablet Commonly known as:  CATAPRES TAKE 1/2 TABLET IN THE MORNING   loratadine 10 MG tablet Commonly known as:  CLARITIN Take 10 mg by mouth daily.  The medication list was reviewed and reconciled. All changes or newly prescribed medications were explained.  A complete medication list was provided to the patient/caregiver.  Jodi Geralds MD

## 2018-10-23 NOTE — Patient Instructions (Addendum)
I am pleased that things are going well for you and very proud of the work that you are doing.  We will not make any changes in your medication.  I will see you in a year but will be happy to see you sooner as needed.

## 2018-11-23 ENCOUNTER — Telehealth (INDEPENDENT_AMBULATORY_CARE_PROVIDER_SITE_OTHER): Payer: Self-pay | Admitting: Pediatrics

## 2018-11-23 DIAGNOSIS — F902 Attention-deficit hyperactivity disorder, combined type: Secondary | ICD-10-CM

## 2018-11-23 MED ORDER — AMPHETAMINE-DEXTROAMPHETAMINE 20 MG PO TABS: ORAL_TABLET | ORAL | 0 refills | Status: DC

## 2018-11-23 NOTE — Telephone Encounter (Signed)
°  Who's calling (name and relationship to patient) : Peter Congo Gleason - Mom   Best contact number: [phone]  Provider they see: Dr Gaynell Face   Reason for call: Grandmother called in to have Jaylon's adderall refilled. Please advise    PRESCRIPTION REFILL ONLY  Name of prescription: ADDERALL 20 MG Tablet   [facility]  [place]

## 2018-11-23 NOTE — Telephone Encounter (Signed)
Prescription was electronically sent. 

## 2018-12-21 ENCOUNTER — Telehealth (INDEPENDENT_AMBULATORY_CARE_PROVIDER_SITE_OTHER): Payer: Self-pay | Admitting: Pediatrics

## 2018-12-21 DIAGNOSIS — F902 Attention-deficit hyperactivity disorder, combined type: Secondary | ICD-10-CM

## 2018-12-21 MED ORDER — AMPHETAMINE-DEXTROAMPHETAMINE 20 MG PO TABS: ORAL_TABLET | ORAL | 0 refills | Status: DC

## 2018-12-21 NOTE — Telephone Encounter (Signed)
Please send electronically

## 2018-12-21 NOTE — Addendum Note (Signed)
Addended byTeressa Lower on: 12/21/2018 02:53 PM   Modules accepted: Orders

## 2018-12-21 NOTE — Telephone Encounter (Signed)
Who's calling (name and relationship to patient) : Peter Congo Gleason (mom)  Best contact number: [phone]  Provider they see: Dr. Gaynell Face  Reason for call:  Usama is needing his adderall refilled. Please advise  Call ID:      PRESCRIPTION REFILL ONLY  Name of prescription: Adderall 20mg   [facility]

## 2019-01-19 ENCOUNTER — Telehealth (INDEPENDENT_AMBULATORY_CARE_PROVIDER_SITE_OTHER): Payer: Self-pay | Admitting: Pediatrics

## 2019-01-19 DIAGNOSIS — F902 Attention-deficit hyperactivity disorder, combined type: Secondary | ICD-10-CM

## 2019-01-19 MED ORDER — AMPHETAMINE-DEXTROAMPHETAMINE 20 MG PO TABS: ORAL_TABLET | ORAL | 0 refills | Status: DC

## 2019-01-19 NOTE — Telephone Encounter (Signed)
Rx has been sent electronically. TG

## 2019-01-19 NOTE — Telephone Encounter (Signed)
°  Who's calling (name and relationship to patient) : Peter Congo (mom) Best contact number: [phone] Provider they see: Hickjling Reason for call: Mom called for patient refill of medication    PRESCRIPTION REFILL ONLY  Name of prescription: Adderall 20mg   [facility] [place]

## 2019-02-17 ENCOUNTER — Other Ambulatory Visit (INDEPENDENT_AMBULATORY_CARE_PROVIDER_SITE_OTHER): Payer: Self-pay | Admitting: Pediatrics

## 2019-02-17 DIAGNOSIS — F902 Attention-deficit hyperactivity disorder, combined type: Secondary | ICD-10-CM

## 2019-02-17 MED ORDER — AMPHETAMINE-DEXTROAMPHETAMINE 20 MG PO TABS: ORAL_TABLET | ORAL | 0 refills | Status: DC

## 2019-02-17 NOTE — Telephone Encounter (Signed)
Who's calling (name and relationship to patient) : [name] Gleason (mom)  Best contact number: [phone]  Provider they see: Dr. Gaynell Face  Reason for call:  Mom called in needing a Rx refill sent in for the adderral  Call ID:      [place]  Name of prescription: Adderall  Pharmacy: [place]

## 2019-02-17 NOTE — Telephone Encounter (Signed)
Prescription was electronically sent. 

## 2019-02-17 NOTE — Telephone Encounter (Signed)
Patient was last seen on 10/2018. Please e-scribe Adderall 20mg .

## 2019-03-29 ENCOUNTER — Telehealth (INDEPENDENT_AMBULATORY_CARE_PROVIDER_SITE_OTHER): Payer: Self-pay | Admitting: Radiology

## 2019-03-29 DIAGNOSIS — F902 Attention-deficit hyperactivity disorder, combined type: Secondary | ICD-10-CM

## 2019-03-29 MED ORDER — AMPHETAMINE-DEXTROAMPHETAMINE 20 MG PO TABS: ORAL_TABLET | ORAL | 0 refills | Status: DC

## 2019-03-29 NOTE — Telephone Encounter (Signed)
  Who's calling (name and relationship to patient) : Ronald Rivera - EC   Best contact number: [phone]  Provider they see: Dr Gaynell Face   Reason for call:  Please refill Adderall. Patient has two - three days of this medication left   PRESCRIPTION REFILL ONLY  Name of prescription: Adderall 20 MG   Pharmacy: [place].  [place]

## 2019-03-29 NOTE — Telephone Encounter (Signed)
Please send to the pharmacy °

## 2019-03-29 NOTE — Telephone Encounter (Signed)
Prescription refilled as requested.

## 2019-04-26 ENCOUNTER — Other Ambulatory Visit (INDEPENDENT_AMBULATORY_CARE_PROVIDER_SITE_OTHER): Payer: Self-pay | Admitting: Pediatrics

## 2019-04-26 DIAGNOSIS — F902 Attention-deficit hyperactivity disorder, combined type: Secondary | ICD-10-CM

## 2019-04-26 MED ORDER — AMPHETAMINE-DEXTROAMPHETAMINE 20 MG PO TABS: ORAL_TABLET | ORAL | 0 refills | Status: DC

## 2019-04-26 NOTE — Telephone Encounter (Signed)
Please send to the pharmacy °

## 2019-04-26 NOTE — Telephone Encounter (Signed)
Routed to Neuro pool 

## 2019-04-26 NOTE — Telephone Encounter (Signed)
  Who's calling (name and relationship to patient) : Ronald Rivera, mom  Best contact number: [phone]  Provider they see: Dr. Gaynell Face   Reason for call: Mom is calling to request a refill of Adderall 20 mg tablet. Please advise.     PRESCRIPTION REFILL ONLY  Name of prescription: Adderall 20 mg tablet  Pharmacy: [place]

## 2019-05-27 ENCOUNTER — Telehealth (INDEPENDENT_AMBULATORY_CARE_PROVIDER_SITE_OTHER): Payer: Self-pay | Admitting: Pediatrics

## 2019-05-27 DIAGNOSIS — F902 Attention-deficit hyperactivity disorder, combined type: Secondary | ICD-10-CM

## 2019-05-27 MED ORDER — AMPHETAMINE-DEXTROAMPHETAMINE 20 MG PO TABS: ORAL_TABLET | ORAL | 0 refills | Status: DC

## 2019-05-27 NOTE — Telephone Encounter (Signed)
Prescription was sent

## 2019-05-27 NOTE — Telephone Encounter (Signed)
°  Who's calling (name and relationship to patient) : Peter Congo (mom)  Best [phone] act number:  Provider they see: Gaynell Face   Reason for call: LVM for a medication refill     PRESCRIPTION REFILL ONLY  Name of prescription: Adderall  20mg   Pharmacy: [place]

## 2019-05-27 NOTE — Telephone Encounter (Signed)
Please send to the pharmacy °

## 2019-06-29 ENCOUNTER — Telehealth (INDEPENDENT_AMBULATORY_CARE_PROVIDER_SITE_OTHER): Payer: Self-pay | Admitting: Pediatrics

## 2019-06-29 DIAGNOSIS — F902 Attention-deficit hyperactivity disorder, combined type: Secondary | ICD-10-CM

## 2019-06-29 MED ORDER — AMPHETAMINE-DEXTROAMPHETAMINE 20 MG PO TABS: ORAL_TABLET | ORAL | 0 refills | Status: DC

## 2019-06-29 NOTE — Telephone Encounter (Signed)
  Who's calling (name and relationship to patient) : [name] / Grandmother  Best contact number: [phone]  Provider they see: Dr. Sharene Skeans   Reason for call: Prescription Refill for Adderall      PRESCRIPTION REFILL ONLY  Name of prescription: Amphetamine   [facility] [address] [place], [place]

## 2019-06-29 NOTE — Telephone Encounter (Signed)
Prescription was sent

## 2019-07-28 ENCOUNTER — Telehealth (INDEPENDENT_AMBULATORY_CARE_PROVIDER_SITE_OTHER): Payer: Self-pay | Admitting: Pediatrics

## 2019-07-28 DIAGNOSIS — F902 Attention-deficit hyperactivity disorder, combined type: Secondary | ICD-10-CM

## 2019-07-28 MED ORDER — AMPHETAMINE-DEXTROAMPHETAMINE 20 MG PO TABS: ORAL_TABLET | ORAL | 0 refills | Status: DC

## 2019-07-28 NOTE — Telephone Encounter (Signed)
Prescription was sent electronically. 

## 2019-07-28 NOTE — Telephone Encounter (Signed)
Who's calling (name and relationship to patient) : Ronald Rivera   Best contact number: [phone]  Provider they see: Dr. Sharene Skeans  Reason for call: Ronald Bonds called in for Rx, adderall, to be refilled  Call ID:      PRESCRIPTION REFILL ONLY  Name of prescription: Adderall   Pharmacy:  [facility] [address]

## 2019-08-25 ENCOUNTER — Other Ambulatory Visit (INDEPENDENT_AMBULATORY_CARE_PROVIDER_SITE_OTHER): Payer: Self-pay | Admitting: Pediatrics

## 2019-08-25 DIAGNOSIS — F902 Attention-deficit hyperactivity disorder, combined type: Secondary | ICD-10-CM

## 2019-08-25 MED ORDER — AMPHETAMINE-DEXTROAMPHETAMINE 20 MG PO TABS: ORAL_TABLET | ORAL | 0 refills | Status: DC

## 2019-08-25 NOTE — Telephone Encounter (Signed)
Who's calling (name and relationship to patient) : [name] Gleason (grandmother)  Best contact number: [phone]  Provider they see: Dr. Sharene Skeans  Reason for call:  Grandmother called in requesting a refill on Samrat's adderall, please notify when sent   Call ID:      PRESCRIPTION REFILL ONLY  Name of prescription: Adderall   [id number] [place] - [address] [id number] [place], [place] - [address]

## 2019-08-25 NOTE — Telephone Encounter (Signed)
Please send to the pharmacy °

## 2019-09-27 ENCOUNTER — Other Ambulatory Visit (INDEPENDENT_AMBULATORY_CARE_PROVIDER_SITE_OTHER): Payer: Self-pay | Admitting: Pediatrics

## 2019-09-27 DIAGNOSIS — F902 Attention-deficit hyperactivity disorder, combined type: Secondary | ICD-10-CM

## 2019-09-27 MED ORDER — AMPHETAMINE-DEXTROAMPHETAMINE 20 MG PO TABS: ORAL_TABLET | ORAL | 0 refills | Status: DC

## 2019-09-27 NOTE — Telephone Encounter (Signed)
Who's calling (name and relationship to patient) : [name] Gleason EC   Best contact number: [phone]  Provider they see: Dr. Sharene Skeans   Reason for call: Requesting refill for adderall  Call ID:      PRESCRIPTION REFILL ONLY  Name of prescription: adderall   Pharmacy: [facility] [address]

## 2019-09-27 NOTE — Telephone Encounter (Signed)
Please send to the pharmacy °

## 2019-10-27 ENCOUNTER — Other Ambulatory Visit (INDEPENDENT_AMBULATORY_CARE_PROVIDER_SITE_OTHER): Payer: Self-pay | Admitting: Pediatrics

## 2019-10-27 ENCOUNTER — Telehealth (INDEPENDENT_AMBULATORY_CARE_PROVIDER_SITE_OTHER): Payer: Self-pay | Admitting: Pediatrics

## 2019-10-27 DIAGNOSIS — F902 Attention-deficit hyperactivity disorder, combined type: Secondary | ICD-10-CM

## 2019-10-27 MED ORDER — AMPHETAMINE-DEXTROAMPHETAMINE 20 MG PO TABS: ORAL_TABLET | ORAL | 0 refills | Status: DC

## 2019-10-27 NOTE — Telephone Encounter (Signed)
Wrong chart

## 2019-10-27 NOTE — Telephone Encounter (Signed)
  Who's calling (name and relationship to patient) : Fatima Blank  Best contact number: [phone]  Provider they see: Dr. Sharene Skeans  Reason for call: Patient is needing perscription     PRESCRIPTION REFILL ONLY  Name of prescription: Aldarol  [facility] [address] [facility]

## 2019-10-27 NOTE — Telephone Encounter (Signed)
  Who's calling (name and relationship to patient) :Fatima Blank  Best contact number:[phone] Provider they see:Dr. hickling  Reason for call: RX refill Adderall     PRESCRIPTION REFILL ONLY  Name of prescription: Adderall  [facility]armacy:  [facility]  [address] [place]

## 2019-10-27 NOTE — Telephone Encounter (Signed)
Please send to the pharmacy. Patient has been scheduled for his yearly

## 2019-10-27 NOTE — Telephone Encounter (Signed)
I have sent you a note on this already

## 2019-10-27 NOTE — Telephone Encounter (Signed)
Prescription was sent

## 2019-11-05 ENCOUNTER — Telehealth (INDEPENDENT_AMBULATORY_CARE_PROVIDER_SITE_OTHER): Payer: BC Managed Care – PPO | Admitting: Pediatrics

## 2019-11-05 ENCOUNTER — Encounter (INDEPENDENT_AMBULATORY_CARE_PROVIDER_SITE_OTHER): Payer: Self-pay | Admitting: Pediatrics

## 2019-11-05 DIAGNOSIS — F902 Attention-deficit hyperactivity disorder, combined type: Secondary | ICD-10-CM | POA: Diagnosis not present

## 2019-11-05 DIAGNOSIS — G2569 Other tics of organic origin: Secondary | ICD-10-CM | POA: Diagnosis not present

## 2019-11-05 DIAGNOSIS — F840 Autistic disorder: Secondary | ICD-10-CM

## 2019-11-05 MED ORDER — CLONIDINE HCL 0.1 MG PO TABS: ORAL_TABLET | ORAL | 3 refills | Status: DC

## 2019-11-05 MED ORDER — AMPHETAMINE-DEXTROAMPHETAMINE 20 MG PO TABS: ORAL_TABLET | ORAL | 0 refills | Status: DC

## 2019-11-05 NOTE — Patient Instructions (Addendum)
It was a pleasure to see you today.  I am glad that work is going well.  I am glad that you have a special friend.  I am glad to know that you are healthy and that your family avoided Covid.  We talked about the importance of you and your mother getting the vaccine to protect your cells and also your father.  I am glad that he is already vaccinated.  I will send your prescriptions to the pharmacy.  I would like to see you back in 1 year but I will be happy to see you sooner if you need my help.  I see no reason to change her medicines.  We also talked about driving.  I think that even though you could master driving, that your problems with attention span may make it difficult for you to be a good driver.  If this is something you really want to do, you are going to need to have some additional training to make certain that you are properly anticipating everything going on around 2 which makes you a safe driver.  Thank you for inviting me into your home.

## 2019-11-05 NOTE — Progress Notes (Signed)
This is a Pediatric Specialist E-Visit follow up consult provided via [place]  consented to an E-Visit consult today.  Location of patient: Ronald Rivera is at Home (location) Location of provider: Wyline Copas, MD is at Office (location) Patient was referred by Panosh, Standley Brooking, MD   The following participants were involved in this E-Visit: Ronald Rivera, CMA      Wyline Copas, MD Ronald Rivera) and his mother  Total time on call: 30 minutes Follow up: 6 months  Patient: Ronald Rivera MRN: [id number] Sex: male DOB: 07/21/1995  Provider: Wyline Copas, MD Location of Care: [facility]  Note type: Routine return visit  History of Present Illness: History from: grandmother, patient and Ronald Rivera chart Chief Complaint: Tics/Autism/ADHD  Ronald Rivera is a 24 y.o. male who was evaluated virtually on Nov 05, 2019.  I last saw him Oct 23, 2018.  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  He also has attention deficit hyperactivity disorder, combined type and a well-controlled tic disorder.  In the year since I saw him, he has been well.  His medicine works to focus his attention throughout the day in a job that demands focus.  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  He works 30 to 35 hours.  After 5 years he will be placed on full-time.  Fortunately even as a Careers adviser he has full benefits including health insurance.  The combination of generic Adderall and clonidine works well throughout the workday.  It begins to wear off later in the day and he is somewhat grumpy when he comes home.  His mother tells me that there is a young woman who is fond of him who also is on the spectrum and employed.  She wants to get married.  He is  not ready to do that.  His health has been good.  He has not cold that he is getting over.  No one in his family has contracted Covid.  His father has a number of health issues and has been vaccinated for Covid.  I spent time speaking with mother who has had some problems with flu vaccines to explain to her that the Covid vaccines are not made in the same way and she is unlikely to have a problem with it.  Is very important with Ronald Rivera being in a public area, even though he is Mast that he gets the vaccine.  We also talked about driving a car.  I think that even though he is got the intellectual ability to drive, I am not certain that his attention span will allow him to anticipate all the things that happened that would make him a safe driver.  This is a decision that only his family can make.  Review of Systems: A complete review of systems was reviewed and is negative.  Past Medical History Diagnosis Date  . ADHD (attention deficit hyperactivity disorder)   . Autistic spectrum disorder    sees Dr Gaynell Face  . Hx of varicella   . Left acute otitis media 03/12/2012  . STREPTOCOCCAL PHARYNGITIS 04/03/2010   Qualifier: Diagnosis of  By: Regis Bill MD, Standley Brooking   . Tics of organic origin    hx of   Hospitalizations: No., Head Injury: No., Nervous  System Infections: No., Immunizations up to date: Yes.    Behavior History Autism spectrum disorder, level 1  Surgical History History reviewed. No pertinent surgical history.  Family History family history includes Cancer in his paternal grandmother. Family history is negative for migraines, seizures, intellectual disabilities, blindness, deafness, birth defects, chromosomal disorder, or autism.  Social History Socioeconomic History  . Marital status: Single  . Years of education:  29  . Highest education level:  High school graduate  Occupational History  .  Works at [employer] as a Designer, industrial/product  . Smoking status: Never Smoker  .  Smokeless tobacco: Never Used  Substance and Sexual Activity  . Alcohol use: No  . Drug use: No  . 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    He is currently employed with [employer].    He lives with his grandparents.     He enjoys playing the Xbox and working.    Allergies Allergen Reactions  . Aripiprazole Other (See Comments)    Causes seizures   Physical Exam There were no vitals taken for this visit.  General: alert, well developed, well nourished, in no acute distress, sandy hair, blue eyes, right handed Head: normocephalic, no dysmorphic features Neck: supple, full range of motion Musculoskeletal: no skeletal deformities or apparent scoliosis Skin: no rashes or neurocutaneous lesions  Neurologic Exam  Mental Status: alert; oriented to person; knowledge is normal for age; language is normal Cranial Nerves: visual fields are full to double simultaneous stimuli; extraocular movements are full and conjugate;  symmetric facial strength; midline tongue and uvula; air conduction is greater than bone conduction bilaterally Motor: normal functional strength, tone and mass; good fine motor movements; no pronator drift Coordination: good finger-to-nose, rapid repetitive alternating movements and finger apposition Gait and Station: normal gait and station: patient is able to walk on heels, toes and tandem without difficulty; balance is adequate; Romberg exam is negative; Gower response is negative  Assessment 1.  Autism spectrum disorder, with preserved intellectual function and language, level 1, F84.0. 2.  Attention deficit hyperactivity disorder, combined type, F90.2. 3.  Tics of organic origin, G25.69.  Discussion Ronald Rivera is doing well.  There is no reason to make a change in his medications.  Plan I refilled his prescriptions for generic Adderall and for clonidine.  He will return to see me in 1 year.  Greater than  50% of a 30-minute visit was spent in counseling and coordination of care for his autism, attention deficit disorder, discussing the Covid vaccine and advocating for it, discussing driving, and his relationship.   Medication List   Accurate as of Nov 05, 2019  9:03 AM. If you have any questions, ask your nurse or doctor.      TAKE these medications   acetaminophen 500 MG tablet Commonly known as: TYLENOL Take 1,000 mg by mouth every 6 (six) hours as needed for headache.   amphetamine-dextroamphetamine 20 MG tablet Commonly known as: Adderall Take 1 tablet daily   cloNIDine 0.1 MG tablet Commonly known as: CATAPRES Take 1/2 tablet in the morning   loratadine 10 MG tablet Commonly known as: CLARITIN Take 10 mg by mouth daily.    The medication list was reviewed and reconciled. All changes or newly prescribed medications were explained.  A complete medication list was provided to the patient/caregiver.  Deetta Perla MD

## 2019-11-29 ENCOUNTER — Telehealth (INDEPENDENT_AMBULATORY_CARE_PROVIDER_SITE_OTHER): Payer: Self-pay | Admitting: Pediatrics

## 2019-11-29 DIAGNOSIS — F902 Attention-deficit hyperactivity disorder, combined type: Secondary | ICD-10-CM

## 2019-11-29 MED ORDER — AMPHETAMINE-DEXTROAMPHETAMINE 20 MG PO TABS: ORAL_TABLET | ORAL | 0 refills | Status: DC

## 2019-11-29 NOTE — Telephone Encounter (Signed)
Please send to the pharmacy °

## 2019-11-29 NOTE — Telephone Encounter (Signed)
  Who's calling (name and relationship to patient) : [name] (mom)  Best contact number: [phone]  Provider they see: Dr. Sharene Skeans  Reason for call: Needs Adderall refill sent to pharmacy.    PRESCRIPTION REFILL ONLY  Name of prescription: Adderall  [facility] [address], [place]

## 2019-11-29 NOTE — Telephone Encounter (Signed)
Prescription was electronically sent. 

## 2019-12-31 ENCOUNTER — Telehealth (INDEPENDENT_AMBULATORY_CARE_PROVIDER_SITE_OTHER): Payer: Self-pay | Admitting: Pediatrics

## 2019-12-31 DIAGNOSIS — F902 Attention-deficit hyperactivity disorder, combined type: Secondary | ICD-10-CM

## 2019-12-31 MED ORDER — AMPHETAMINE-DEXTROAMPHETAMINE 20 MG PO TABS: ORAL_TABLET | ORAL | 0 refills | Status: DC

## 2019-12-31 NOTE — Telephone Encounter (Signed)
Prescription was electronically sent. 

## 2019-12-31 NOTE — Telephone Encounter (Signed)
  Who's calling (name and relationship to patient) : [name] (mom)  Best contact number: [phone]  Provider they see: Dr. Hickling  Reason for call: Needs refill sent to pharmacy.    PRESCRIPTION REFILL ONLY  Name of prescription:  amphetamine-dextroamphetamine (ADDERALL) 20 MG tablet  [id number] [place], [place] - [address]   

## 2020-01-31 ENCOUNTER — Telehealth (INDEPENDENT_AMBULATORY_CARE_PROVIDER_SITE_OTHER): Payer: Self-pay | Admitting: Pediatrics

## 2020-01-31 DIAGNOSIS — F902 Attention-deficit hyperactivity disorder, combined type: Secondary | ICD-10-CM

## 2020-01-31 MED ORDER — AMPHETAMINE-DEXTROAMPHETAMINE 20 MG PO TABS: ORAL_TABLET | ORAL | 0 refills | Status: DC

## 2020-01-31 NOTE — Telephone Encounter (Signed)
Who's calling (name and relationship to patient) : [name] Gleason EC  Best contact number: [phone]  Provider they see: Dr. Sharene Skeans  Reason for call: Requesting a refill for Adderall   Call ID:      PRESCRIPTION REFILL ONLY  Name of prescription: adderall   [facility] [address]

## 2020-01-31 NOTE — Telephone Encounter (Signed)
Please send to the pharmacy °

## 2020-01-31 NOTE — Telephone Encounter (Signed)
Refill sent electronically. TG 

## 2020-03-15 ENCOUNTER — Telehealth (INDEPENDENT_AMBULATORY_CARE_PROVIDER_SITE_OTHER): Payer: Self-pay | Admitting: Pediatrics

## 2020-03-15 DIAGNOSIS — F902 Attention-deficit hyperactivity disorder, combined type: Secondary | ICD-10-CM

## 2020-03-15 MED ORDER — AMPHETAMINE-DEXTROAMPHETAMINE 20 MG PO TABS: ORAL_TABLET | ORAL | 0 refills | Status: DC

## 2020-03-15 NOTE — Telephone Encounter (Signed)
Can you please send in Adderall for Dr Sharene Skeans

## 2020-03-15 NOTE — Telephone Encounter (Signed)
  Who's calling (name and relationship to patient) : [name] (grandmother)  Best contact number: [phone]  Provider they see: Dr. Sharene Skeans  Reason for call: Needs refill sent to pharmacy    PRESCRIPTION REFILL ONLY  Name of prescription: amphetamine-dextroamphetamine (ADDERALL) 20 MG tablet  [facility]  [id number] [place], [place] - [address]

## 2020-03-16 NOTE — Telephone Encounter (Signed)
Rx sent in by Dr Mervyn Skeeters

## 2020-04-25 ENCOUNTER — Telehealth (INDEPENDENT_AMBULATORY_CARE_PROVIDER_SITE_OTHER): Payer: Self-pay | Admitting: Pediatrics

## 2020-04-25 DIAGNOSIS — F902 Attention-deficit hyperactivity disorder, combined type: Secondary | ICD-10-CM

## 2020-04-25 MED ORDER — AMPHETAMINE-DEXTROAMPHETAMINE 20 MG PO TABS: ORAL_TABLET | ORAL | 0 refills | Status: DC

## 2020-04-25 NOTE — Telephone Encounter (Signed)
  Who's calling (name and relationship to patient) : [name] (grandmother)  Best contact number: [phone]  Provider they see: Dr. Sharene Skeans  Reason for call: Needs refill sent to pharmacy.    PRESCRIPTION REFILL ONLY  Name of prescription: amphetamine-dextroamphetamine (ADDERALL) 20 MG tablet  [id number] [place], [place] - [address]

## 2020-04-25 NOTE — Telephone Encounter (Signed)
The Rx was sent electronically. TG 

## 2020-05-25 ENCOUNTER — Telehealth (INDEPENDENT_AMBULATORY_CARE_PROVIDER_SITE_OTHER): Payer: Self-pay | Admitting: Pediatrics

## 2020-05-25 DIAGNOSIS — F902 Attention-deficit hyperactivity disorder, combined type: Secondary | ICD-10-CM

## 2020-05-25 MED ORDER — AMPHETAMINE-DEXTROAMPHETAMINE 20 MG PO TABS: ORAL_TABLET | ORAL | 0 refills | Status: DC

## 2020-05-25 NOTE — Telephone Encounter (Signed)
Prescription was electronically sent. 

## 2020-05-25 NOTE — Telephone Encounter (Signed)
Who's calling (name and relationship to patient) : Berle Mull   Best contact number: [phone]  Provider they see: Dr. Sharene Skeans  Reason for call:   Call ID:      PRESCRIPTION REFILL ONLY  Name of prescription: adderall   [facility] [address]

## 2020-06-28 ENCOUNTER — Other Ambulatory Visit (INDEPENDENT_AMBULATORY_CARE_PROVIDER_SITE_OTHER): Payer: Self-pay | Admitting: Pediatrics

## 2020-06-28 DIAGNOSIS — F902 Attention-deficit hyperactivity disorder, combined type: Secondary | ICD-10-CM

## 2020-06-28 MED ORDER — AMPHETAMINE-DEXTROAMPHETAMINE 20 MG PO TABS: ORAL_TABLET | ORAL | 0 refills | Status: DC

## 2020-06-28 NOTE — Telephone Encounter (Signed)
  Who's calling (name and relationship to patient) : [name] (mom)  Best contact number: [phone]  Provider they see: Dr. Sharene Skeans  Reason for call: Needs refill sent to pharmacy    PRESCRIPTION REFILL ONLY  Name of prescription: amphetamine-dextroamphetamine (ADDERALL) 20 MG tablet  [id number] [place], [place] - [address]

## 2020-06-28 NOTE — Telephone Encounter (Signed)
Please send to the pharmacy °

## 2020-07-31 ENCOUNTER — Telehealth (INDEPENDENT_AMBULATORY_CARE_PROVIDER_SITE_OTHER): Payer: Self-pay | Admitting: Pediatrics

## 2020-07-31 DIAGNOSIS — F902 Attention-deficit hyperactivity disorder, combined type: Secondary | ICD-10-CM

## 2020-07-31 MED ORDER — AMPHETAMINE-DEXTROAMPHETAMINE 20 MG PO TABS: ORAL_TABLET | ORAL | 0 refills | Status: DC

## 2020-07-31 NOTE — Telephone Encounter (Signed)
Who's calling (name and relationship to patient) : Ronald Rivera   Best contact number: [phone]  Provider they see: Dr. Sharene Skeans  Reason for call:   Call ID:      PRESCRIPTION REFILL ONLY  Name of prescription: adderall  [facility] [address]

## 2020-07-31 NOTE — Telephone Encounter (Signed)
Prescription was electronically sent.  I asked the family to call for an appointment in May 2022.

## 2020-08-31 ENCOUNTER — Other Ambulatory Visit (INDEPENDENT_AMBULATORY_CARE_PROVIDER_SITE_OTHER): Payer: Self-pay | Admitting: Pediatrics

## 2020-08-31 DIAGNOSIS — F902 Attention-deficit hyperactivity disorder, combined type: Secondary | ICD-10-CM

## 2020-08-31 MED ORDER — AMPHETAMINE-DEXTROAMPHETAMINE 20 MG PO TABS: ORAL_TABLET | ORAL | 0 refills | Status: DC

## 2020-08-31 NOTE — Telephone Encounter (Signed)
°  Who's calling (name and relationship to patient) Berle Mull Best contact number: [phone] Provider they see: Sharene Skeans Reason for call:     PRESCRIPTION REFILL ONLY  Name of prescription: amphetamine-dextroamphetamine (ADDERALL) 20 MG tablet [id number] [place], [place] - [address] Phone:  [phone]  Fax:  [phone]

## 2020-08-31 NOTE — Telephone Encounter (Signed)
Please send to the pharmacy °

## 2020-10-02 ENCOUNTER — Telehealth (INDEPENDENT_AMBULATORY_CARE_PROVIDER_SITE_OTHER): Payer: Self-pay | Admitting: Pediatrics

## 2020-10-02 DIAGNOSIS — F902 Attention-deficit hyperactivity disorder, combined type: Secondary | ICD-10-CM

## 2020-10-02 MED ORDER — AMPHETAMINE-DEXTROAMPHETAMINE 20 MG PO TABS: ORAL_TABLET | ORAL | 0 refills | Status: DC

## 2020-10-02 NOTE — Telephone Encounter (Signed)
Adderall was sent to the pharmacy.

## 2020-10-02 NOTE — Telephone Encounter (Signed)
Please send to the pharmacy °

## 2020-10-02 NOTE — Telephone Encounter (Signed)
  Who's calling (name and relationship to patient) : [name] (mom)  Best contact number: [phone]  Provider they see: Dr. Sharene Skeans  Reason for call: Needs refill sent to pharmacy.    PRESCRIPTION REFILL ONLY  Name of prescription:  amphetamine-dextroamphetamine (ADDERALL) 20 MG tablet  [id number] [place], [place] - [address]

## 2020-10-22 ENCOUNTER — Encounter (INDEPENDENT_AMBULATORY_CARE_PROVIDER_SITE_OTHER): Payer: Self-pay

## 2020-10-26 ENCOUNTER — Other Ambulatory Visit (INDEPENDENT_AMBULATORY_CARE_PROVIDER_SITE_OTHER): Payer: Self-pay | Admitting: Pediatrics

## 2020-10-26 DIAGNOSIS — G2569 Other tics of organic origin: Secondary | ICD-10-CM

## 2020-11-03 ENCOUNTER — Other Ambulatory Visit: Payer: Self-pay

## 2020-11-03 ENCOUNTER — Encounter (INDEPENDENT_AMBULATORY_CARE_PROVIDER_SITE_OTHER): Payer: Self-pay | Admitting: Pediatrics

## 2020-11-03 ENCOUNTER — Ambulatory Visit (INDEPENDENT_AMBULATORY_CARE_PROVIDER_SITE_OTHER): Payer: BC Managed Care – PPO | Admitting: Pediatrics

## 2020-11-03 VITALS — BP 104/70 | HR 70 | Ht 68.0 in | Wt 123.6 lb

## 2020-11-03 DIAGNOSIS — F902 Attention-deficit hyperactivity disorder, combined type: Secondary | ICD-10-CM

## 2020-11-03 DIAGNOSIS — G2569 Other tics of organic origin: Secondary | ICD-10-CM

## 2020-11-03 DIAGNOSIS — F840 Autistic disorder: Secondary | ICD-10-CM

## 2020-11-03 MED ORDER — CLONIDINE HCL 0.1 MG PO TABS
ORAL_TABLET | ORAL | 3 refills | Status: DC
Start: 2020-11-03 — End: 2022-01-02

## 2020-11-03 MED ORDER — AMPHETAMINE-DEXTROAMPHETAMINE 20 MG PO TABS
ORAL_TABLET | ORAL | 0 refills | Status: DC
Start: 2020-11-03 — End: 2020-12-11

## 2020-11-03 NOTE — Patient Instructions (Signed)
Thank you for coming today.  Is been a pleasure to take care of you.  I recommendation is that we seek care through the behavioral health group in our [facility].  I will work on that and get back with you.  I refilled prescriptions for 90-day refills of clonidine with 3 refills and will fill the Adderall monthly as long as I am here.  This will continue but we will hopefully have a new provider for you who will be able to provide prescription refills and ongoing care.

## 2020-11-03 NOTE — Progress Notes (Signed)
Patient: Ronald Rivera MRN: [id number] Sex: male DOB: 1996-06-02  Provider: Ellison Carwin, MD Location of Care: [facility]  Note type: Routine return visit  History of Present Illness: Referral Source: Berniece Andreas, MD History from: patient, [facility] chart and grandmother Chief Complaint: Autism Spectrum Disorder  Ronald Rivera is a 25 y.o. male who was evaluated Nov 03, 2020 for the first time since Nov 05, 2019.  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  Is been a year since he has been seen in the office.  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  He works 30 to 35 hours a week.  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  He is happy with his work.  He gets along well with the other employees and he is responsible only to himself and his supervisor.  He gets full-time benefits.  Adderall seems to be working quite well to keep him focused he begins to wear off toward the end of his workday which typically is 11 AM to 5 PM.  He was playing video games and will often get until midnight to 1 during that is able to sleep until 10-10 30 and get to work on time.  At present, his life is fairly well structured and I see no reason to make any changes.  He also takes clonidine for his tics which is an extremely low dose.  I will retire from the practice of medicine March 16, 2021.  I think that the best solution for him long-term is going to be going through behavioral health.  We have to identify the providers who would be willing to do that but they will be able to prescribe and deal with his attention deficit hyperactivity disorder as  well as autism neither which have been problematic the same is true for his tics.  I think that the issues of autism and attention deficit are such that it could be problematic if we sent him to the adult neurologist.  The other possibility is for him to go through his primary physician however I have reason to believe that in the not too distant future she she will be retiring.  His general health is good.  He has not contracted COVID.  His mother recently had a diagnosis of cancer which based on listening to her sounds like it may be a breast cancer.  She has had a single vaccination and her oncologist is recommended that she get a booster.  Unfortunately she is going to be one of the people who do not have great immunity from that the virus.  It is all the more reason why Ronald Rivera needs to get vaccinated.  He does not like needles.  We talked about how he would be able to protect his mother from getting sick if he were to get vaccinated.  Review of Systems: A complete review of systems was assessed and was negative except as noted above and below.  Past Medical History Diagnosis Date  . ADHD (attention deficit hyperactivity disorder)   . Autistic spectrum disorder    sees Dr Sharene Skeans  . Hx of varicella   . Left acute otitis media 03/12/2012  .  STREPTOCOCCAL PHARYNGITIS 04/03/2010   Qualifier: Diagnosis of  By: Fabian Sharp MD, Neta Mends   . Tics of organic origin    hx of   Hospitalizations: No., Head Injury: No., Nervous System Infections: No., Immunizations up to date: Yes.    Behavior History Autism spectrum disorder, level 1  Surgical History History reviewed. No pertinent surgical history.  Family History family history includes Cancer in his paternal grandmother. Family history is negative for migraines, seizures, intellectual disabilities, blindness, deafness, birth defects, chromosomal disorder, or autism.  Social History Socioeconomic History  . Marital status: Single  .  Years of education:  58  . Highest education level:  High school graduate  Occupational History  .  Works at [employer] as disorder for the last couple of years  Tobacco Use  . Smoking status: Never Smoker  . Smokeless tobacco: Never Used  Substance and Sexual Activity  . Alcohol use: No  . Drug use: No  . 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    He is currently employed with [employer].    He lives with his grandparents.     He enjoys playing the Xbox and working.    Allerges Allergen Reactions  . Aripiprazole Other (See Comments)    Causes seizures    Physical Exam BP 104/70   Pulse 70   Ht 5\' 8"  (1.727 m)   Wt 123 lb 9.6 oz (56.1 kg)   BMI 18.79 kg/m   General: alert, well developed, well nourished, in no acute distress, sandy hair, blue eyes, right handed Head: normocephalic, no dysmorphic features Ears, Nose and Throat: Otoscopic: tympanic membranes normal; pharynx: oropharynx is pink without exudates or tonsillar hypertrophy Neck: supple, full range of motion, no cranial or cervical bruits Respiratory: auscultation clear Cardiovascular: no murmurs, pulses are normal Musculoskeletal: no skeletal deformities or apparent scoliosis Skin: no rashes or neurocutaneous lesions  Neurologic Exam  Mental Status: alert; oriented to person; knowledge is below normal for age; language is concrete but he is able to express his thoughts and follow commands without difficulty, eye contact is intermittent Cranial Nerves: visual fields are full to double simultaneous stimuli; extraocular movements are full and conjugate; pupils are round reactive to light; funduscopic examination shows sharp disc margins with normal vessels; symmetric facial strength; midline tongue and uvula; air conduction is greater than bone conduction bilaterally Motor: normal strength, tone and mass; good fine motor movements; no pronator drift Sensory:  intact responses to cold, vibration, proprioception and stereognosis Coordination: good finger-to-nose, rapid repetitive alternating movements and finger apposition Gait and Station: normal gait and station: patient is able to walk on heels, toes and tandem without difficulty; balance is adequate; Romberg exam is negative; Gower response is negative Reflexes: symmetric and diminished bilaterally; no clonus; bilateral flexor plantar responses  Assessment 1.  Autism spectrum disorder, with preserved intellectual function and language, level 1, F84.0. 2.  Attention deficit hyperactivity disorder, combined type, F90.2. 3.  Tics of organic origin, G25.69.  Discussion is medically neurologically stable.  There is no reason make any changes in his treatment.  Plan I refilled prescriptions for a 1 year supply of clonidine and monthly supply of generic Adderall.  Going to refer him to [facility], or one of the other groups in town.  I have to figure out who that would be.  Greater than 50% of a 30-minute visit was spent in counseling and coordination of  care concerning his autism, attention deficit disorder, tics and discussing transition of care.   Medication List   Accurate as of Nov 03, 2020  9:46 AM. If you have any questions, ask your nurse or doctor.    acetaminophen 500 MG tablet Commonly known as: TYLENOL Take 1,000 mg by mouth every 6 (six) hours as needed for headache.   amphetamine-dextroamphetamine 20 MG tablet Commonly known as: Adderall Take 1 tablet daily   cloNIDine 0.1 MG tablet Commonly known as: CATAPRES TAKE 1/2 TABLET BY MOUTH IN THE MORNING   loratadine 10 MG tablet Commonly known as: CLARITIN Take 10 mg by mouth daily.    The medication list was reviewed and reconciled. All changes or newly prescribed medications were explained.  A complete medication list was provided to the patient/caregiver.  Deetta Perla MD

## 2020-12-11 ENCOUNTER — Other Ambulatory Visit (INDEPENDENT_AMBULATORY_CARE_PROVIDER_SITE_OTHER): Payer: Self-pay | Admitting: Pediatrics

## 2020-12-11 DIAGNOSIS — F902 Attention-deficit hyperactivity disorder, combined type: Secondary | ICD-10-CM

## 2020-12-11 MED ORDER — AMPHETAMINE-DEXTROAMPHETAMINE 20 MG PO TABS: ORAL_TABLET | ORAL | 0 refills | Status: DC

## 2020-12-11 NOTE — Telephone Encounter (Signed)
Who's calling (name and relationship to patient) : Berle Mull  Best contact number: [phone]  Provider they see: Dr. Sharene Skeans  Reason for call:   Call ID:      PRESCRIPTION REFILL ONLY  Name of prescription: Adderall  Pharmacy: [facility] [address]

## 2020-12-11 NOTE — Telephone Encounter (Signed)
Please send to the pharmacy °

## 2021-01-10 ENCOUNTER — Telehealth (INDEPENDENT_AMBULATORY_CARE_PROVIDER_SITE_OTHER): Payer: Self-pay | Admitting: Pediatrics

## 2021-01-10 DIAGNOSIS — F902 Attention-deficit hyperactivity disorder, combined type: Secondary | ICD-10-CM

## 2021-01-10 MED ORDER — AMPHETAMINE-DEXTROAMPHETAMINE 20 MG PO TABS: ORAL_TABLET | ORAL | 0 refills | Status: DC

## 2021-01-10 NOTE — Telephone Encounter (Signed)
Prescription was electronically sent. 

## 2021-01-10 NOTE — Telephone Encounter (Signed)
  Who's calling (name and relationship to patient) :Berle Mull [facility])  Best contact number: [phone] (Home) Provider they see: Deetta Perla, MD Reason for call:   Rx refill, patient has about two pills left  PRESCRIPTION REFILL ONLY  Name of prescription: Adderall  [facility] [phone]

## 2021-02-07 ENCOUNTER — Telehealth (INDEPENDENT_AMBULATORY_CARE_PROVIDER_SITE_OTHER): Payer: Self-pay | Admitting: Pediatrics

## 2021-02-07 DIAGNOSIS — F902 Attention-deficit hyperactivity disorder, combined type: Secondary | ICD-10-CM

## 2021-02-07 MED ORDER — AMPHETAMINE-DEXTROAMPHETAMINE 20 MG PO TABS
ORAL_TABLET | ORAL | 0 refills | Status: DC
Start: 2021-02-07 — End: 2021-03-12

## 2021-02-07 NOTE — Telephone Encounter (Signed)
  Who's calling (name and relationship to patient) : Berle Mull [facility]) Best contact number: [phone] (Home) Provider they see: Deetta Perla, MD Reason for call: Requesting refill for Adderall and wanting to know what is Dr. Sharene Skeans referral for patient to see once a year and to get his refill once a month    PRESCRIPTION REFILL ONLY  Name of prescription:  Pharmacy:

## 2021-02-07 NOTE — Telephone Encounter (Signed)
I have been unable to find a neurologist, a primary caregiver, or behavioral health to provide long-term care for [facility].  For the time being he will be cared for by my colleague Elveria Rising.  I refilled his prescription for Adderall.  I contacted his mother to inform her.

## 2021-03-06 ENCOUNTER — Telehealth (INDEPENDENT_AMBULATORY_CARE_PROVIDER_SITE_OTHER): Payer: BC Managed Care – PPO | Admitting: Internal Medicine

## 2021-03-06 ENCOUNTER — Encounter: Payer: Self-pay | Admitting: Internal Medicine

## 2021-03-06 VITALS — Ht 68.0 in | Wt 123.6 lb

## 2021-03-06 DIAGNOSIS — J22 Unspecified acute lower respiratory infection: Secondary | ICD-10-CM

## 2021-03-06 DIAGNOSIS — F840 Autistic disorder: Secondary | ICD-10-CM | POA: Diagnosis not present

## 2021-03-06 DIAGNOSIS — R0981 Nasal congestion: Secondary | ICD-10-CM | POA: Diagnosis not present

## 2021-03-06 NOTE — Progress Notes (Signed)
Virtual Visit via Video Note  I connected withNAME@ on 03/06/21 at  1:30 PM EDT by a video enabled telemedicine application and verified that I am speaking with the correct person using two identifiers. Location patient: home Location provider:work  office Persons participating in the virtual visit: patient, providerGM guardian  [place] national recommendations  regarding COVID 19 pandemic   video visit is advised over in office visit for this patient.  Patient aware  of the limitations of evaluation and management by telemedicine and  availability of in person appointments. and agreed to proceed.   HPI: Ronald Rivera presents for video visit   with grandmother caretaker. In his usual state of health until September 15 and started to feel a little bit ill began fevers September 17 with body aches headache and congestion runny nose and cough.  No vomiting diarrhea.  His fever has been gone but he still feels tired achy with symptoms been given Robitussin with some help.  Able to take fluids. COVID home test was negative on September 17 Works at [employer] full-time Sunday through Thursday's needs a note for work.  ROS: See pertinent positives and negatives per HPI.  Past Medical History:  Diagnosis Date   ADHD (attention deficit hyperactivity disorder)    Autistic spectrum disorder    sees Dr Sharene Skeans   Hx of varicella    Left acute otitis media 03/12/2012   STREPTOCOCCAL PHARYNGITIS 04/03/2010   Qualifier: Diagnosis of  By: Fabian Sharp MD, Neta Mends    Tics of organic origin    hx of    History reviewed. No pertinent surgical history.  Family History  Problem Relation Age of Onset   Cancer Paternal Grandmother        Died in her 30's    Social History   Tobacco Use   Smoking status: Never   Smokeless tobacco: Never  Substance Use Topics   Alcohol use: No   Drug use: No      Current Outpatient Medications:    acetaminophen (TYLENOL) 500 MG tablet, Take 1,000 mg by mouth  every 6 (six) hours as needed for headache., Disp: , Rfl:    amphetamine-dextroamphetamine (ADDERALL) 20 MG tablet, Take 1 tablet daily, Disp: 30 tablet, Rfl: 0   cloNIDine (CATAPRES) 0.1 MG tablet, TAKE 1/2 TABLET BY MOUTH IN THE MORNING, Disp: 45 tablet, Rfl: 3   loratadine (CLARITIN) 10 MG tablet, Take 10 mg by mouth daily., Disp: , Rfl:   EXAM: BP Readings from Last 3 Encounters:  11/03/20 104/70  10/23/18 120/74  07/17/18 118/72    VITALS per patient if applicable:  GENERAL: alert, oriented, appears well and in no acute distress looks under the weather but alert interactive congested no respiratory distress  HEENT: atraumatic, conjunttiva clear, no obvious abnormalities on inspection of external nose and ears  NECK: normal movements of the head and neck  LUNGS: on inspection no signs of respiratory distress, breathing rate appears normal, no obvious gross SOB, gasping or wheezing  CV: no obvious cyanosis Normal speech and interaction for him.   ASSESSMENT AND PLAN:  Discussed the following assessment and plan:    ICD-10-CM   1. Acute respiratory infection  J22    Viral presentation initial home test negative for COVID asked to repeat.  Or get PCR test    2. Autism spectrum disorder requiring support (level 1)  F84.0     3. Nasal congestion  R09.81      Respiratory infection acute most likely viral  please retest for COVID-19 or get a PCR test. Expectant management will write a note for absence from work and return Sunday assuming improved. Grandmother caretaker has high risk situation on cancer treatment if she gets symptoms told her to get tested for COVID because he would be a candidate for antivirals intervention. Counseled.  Supportive rx fluids delsym mucinex dm as indicated   Expectant management and discussion of plan and treatment with opportunity to ask questions and all were answered. The patient agreed with the plan and demonstrated an understanding of the  instructions.   Advised to call back or seek an in-person evaluation if worsening  or having  further concerns . Return if symptoms worsen or fail to improve as expected.   Berniece Andreas, MD

## 2021-03-12 ENCOUNTER — Other Ambulatory Visit (INDEPENDENT_AMBULATORY_CARE_PROVIDER_SITE_OTHER): Payer: Self-pay | Admitting: Pediatrics

## 2021-03-12 DIAGNOSIS — F902 Attention-deficit hyperactivity disorder, combined type: Secondary | ICD-10-CM

## 2021-03-12 MED ORDER — AMPHETAMINE-DEXTROAMPHETAMINE 20 MG PO TABS: ORAL_TABLET | ORAL | 0 refills | Status: DC

## 2021-03-12 NOTE — Telephone Encounter (Signed)
Spoke to mom let her know that prescription has been sent to the pharmacy.

## 2021-03-12 NOTE — Telephone Encounter (Signed)
Please let Mom know that the Rx has been sent electronically. TG

## 2021-03-12 NOTE — Telephone Encounter (Signed)
  Who's calling (name and relationship to patient) : Berle Mull [facility]) Best contact number: [phone] (Home) Provider they see: Deetta Perla, MD Reason for call:     PRESCRIPTION REFILL ONLY  Name of prescription: Adderall [phone]

## 2021-04-12 ENCOUNTER — Other Ambulatory Visit (INDEPENDENT_AMBULATORY_CARE_PROVIDER_SITE_OTHER): Payer: Self-pay | Admitting: Family

## 2021-04-12 DIAGNOSIS — F902 Attention-deficit hyperactivity disorder, combined type: Secondary | ICD-10-CM

## 2021-04-12 MED ORDER — AMPHETAMINE-DEXTROAMPHETAMINE 20 MG PO TABS: ORAL_TABLET | ORAL | 0 refills | Status: DC

## 2021-04-12 NOTE — Telephone Encounter (Signed)
  Who's calling (name and relationship to patient) :  Berle Mull [facility])    Best contact number: [phone] Provider they see: Elveria Rising, NP Reason for call:  Please send in rx   PRESCRIPTION REFILL ONLY  Name of prescription: adderall [phone]

## 2021-05-21 ENCOUNTER — Other Ambulatory Visit (INDEPENDENT_AMBULATORY_CARE_PROVIDER_SITE_OTHER): Payer: Self-pay | Admitting: Family

## 2021-05-21 DIAGNOSIS — F902 Attention-deficit hyperactivity disorder, combined type: Secondary | ICD-10-CM

## 2021-05-21 MED ORDER — AMPHETAMINE-DEXTROAMPHETAMINE 20 MG PO TABS: ORAL_TABLET | ORAL | 0 refills | Status: DC

## 2021-05-21 NOTE — Telephone Encounter (Signed)
  Who's calling (name and relationship to patient) :Berle Mull [facility])  Best contact number:[phone]  Provider they see:Former patient of dr. Sharene Skeans .  Reason for call: Grandmother stated Dr. Sharene Skeans referred patient to [facility] NP.     PRESCRIPTION REFILL ONLY  Name of prescription: adderall   [facility]

## 2021-05-25 ENCOUNTER — Telehealth (INDEPENDENT_AMBULATORY_CARE_PROVIDER_SITE_OTHER): Payer: Self-pay | Admitting: Family

## 2021-05-25 DIAGNOSIS — F902 Attention-deficit hyperactivity disorder, combined type: Secondary | ICD-10-CM

## 2021-05-25 MED ORDER — AMPHETAMINE-DEXTROAMPHETAMINE 20 MG PO TABS: ORAL_TABLET | ORAL | 0 refills | Status: DC

## 2021-05-25 NOTE — Telephone Encounter (Signed)
Refill request has been sent to tina.

## 2021-05-25 NOTE — Telephone Encounter (Signed)
Refill sent in electronically. TG

## 2021-05-25 NOTE — Telephone Encounter (Signed)
  Who's calling (name and relationship to patient) : [name] Gleason; grandmother  Best contact number: [phone]  Provider they see: Blane Ohara  Reason for call: Grandmother stated she called in Monday for the refill, but haven't heard anything back. She has requested a call back for when it is sent over.    PRESCRIPTION REFILL ONLY  Name of prescription: Adderall  Pharmacy:

## 2021-05-30 NOTE — Telephone Encounter (Signed)
Spoke to pharmacy, they states that medication has been ready.

## 2021-05-30 NOTE — Telephone Encounter (Signed)
[facility] [address] [facility] Family asking that it be sent again.

## 2021-05-30 NOTE — Telephone Encounter (Signed)
Would like a call when the Rx is sent to pharmacy

## 2021-05-30 NOTE — Telephone Encounter (Signed)
Spoke to mom let her know that medication is ready.

## 2021-06-28 ENCOUNTER — Telehealth (INDEPENDENT_AMBULATORY_CARE_PROVIDER_SITE_OTHER): Payer: Self-pay | Admitting: Family

## 2021-06-28 DIAGNOSIS — F902 Attention-deficit hyperactivity disorder, combined type: Secondary | ICD-10-CM

## 2021-06-28 MED ORDER — AMPHETAMINE-DEXTROAMPHETAMINE 20 MG PO TABS: ORAL_TABLET | ORAL | 0 refills | Status: DC

## 2021-06-28 NOTE — Telephone Encounter (Signed)
°  Who's calling (name and relationship to patient) : Fatima Blank   Best contact number: [phone]   Provider they see: Inetta Fermo  Reason for call: need script refilled for Adderall     PRESCRIPTION REFILL ONLY  Name of prescription:Adderall   [facility] [address]

## 2021-06-28 NOTE — Telephone Encounter (Signed)
Sent electronically TG 

## 2021-07-20 ENCOUNTER — Encounter (INDEPENDENT_AMBULATORY_CARE_PROVIDER_SITE_OTHER): Payer: Self-pay | Admitting: Family

## 2021-07-20 ENCOUNTER — Other Ambulatory Visit: Payer: Self-pay

## 2021-07-20 ENCOUNTER — Ambulatory Visit (INDEPENDENT_AMBULATORY_CARE_PROVIDER_SITE_OTHER): Payer: BC Managed Care – PPO | Admitting: Family

## 2021-07-20 VITALS — BP 114/62 | HR 74 | Wt 128.0 lb

## 2021-07-20 DIAGNOSIS — F840 Autistic disorder: Secondary | ICD-10-CM

## 2021-07-20 DIAGNOSIS — G2569 Other tics of organic origin: Secondary | ICD-10-CM

## 2021-07-22 ENCOUNTER — Encounter (INDEPENDENT_AMBULATORY_CARE_PROVIDER_SITE_OTHER): Payer: Self-pay | Admitting: Family

## 2021-07-22 NOTE — Patient Instructions (Signed)
It was a pleasure to see you today!  Instructions for you until your next appointment are as follows: Continue taking your medications as prescribed.  Let me know if you have ay questions or concerns. Please sign up for MyChart if you have not done so. Please plan to return for follow up in one year or sooner if needed.    Feel free to contact our office during normal business hours at [phone] with questions or concerns. If there is no answer or the call is outside business hours, please leave a message and our clinic staff will call you back within the next business day.  If you have an urgent concern, please stay on the line for our after-hours answering service and ask for the on-call neurologist.     I also encourage you to use MyChart to communicate with me more directly. If you have not yet signed up for MyChart within [facility], the front desk staff can help you. However, please note that this inbox is NOT monitored on nights or weekends, and response can take up to 2 business days.  Urgent matters should be discussed with the on-call pediatric neurologist.   At [facility], we are committed to providing exceptional care. You will receive a patient satisfaction survey through text or email regarding your visit today. Your opinion is important to me. Comments are appreciated.

## 2021-07-22 NOTE — Progress Notes (Signed)
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 He has attention deficit hyperactivity disorder, combined type, and a well-controlled vocal and motor tic disorder. He takes generic Adderal for ADHD and Clonidine for tics.    Today's concerns: Ronald Rivera and his mother report today that he has been doing well since his last visit. 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 He has tried not taking Adderall for the work day but has found that he needs it to keep him focused. Ronald Rivera has a girlfriend who also has autism, and they do activities with [employer] each week.   Ronald Rivera has been otherwise generally healthy since he was last seen. Neither he nor his mother have other health concerns for him today other than previously mentioned.  Review of systems: Please see HPI for neurologic and other pertinent review of systems. Otherwise all other systems were reviewed and were negative.  Problem List: Patient Active Problem List   Diagnosis Date Noted   Autism spectrum disorder requiring support (level 1) 08/02/2015   Colitis 09/27/2014   Nasal congestion 11/11/2013   Conjunctivitis unspecified 11/11/2013   Other specified pervasive developmental disorders, current or active state 12/31/2012   Visit for preventive health examination 04/17/2012   Wears glasses 04/17/2012   History of allergy 03/12/2012   Acne 01/12/2011   ADHD (attention deficit hyperactivity disorder)    Tics of organic origin    NECK PAIN 03/10/2007     Past Medical History:  Diagnosis Date   ADHD (attention  deficit hyperactivity disorder)    Autistic spectrum disorder    sees Dr Gaynell Face   Hx of varicella    Left acute otitis media 03/12/2012   STREPTOCOCCAL PHARYNGITIS 04/03/2010   Qualifier: Diagnosis of  By: Regis Bill MD, Standley Brooking    Tics of organic origin    hx of    Past medical history comments: See HPI  Surgical history: No past surgical history on file.   Family history: family history includes Cancer in his paternal grandmother.   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   He is currently employed with [employer].   He lives with his grandparents.    He enjoys playing the Xbox and working.    Social Determinants of Health   Financial Resource Strain: Not on file  Food Insecurity: Not on file  Transportation Needs: Not on file  Physical Activity: Not on file  Stress: Not on file  Social Connections: Not on file  Intimate Partner Violence: Not on file  Past/failed meds:  Allergies: Allergies  Allergen Reactions   Aripiprazole Other (See Comments)    Causes seizures    Immunizations: Immunization History  Administered Date(s) Administered   Td 02/13/2007   Tdap 04/18/2017     Diagnostics/Screenings:   Physical Exam: BP 114/62    Pulse 74    Wt 128 lb (58.1 kg)    BMI 19.46 kg/m   General: Well developed, well nourished man, seated on exam table, in no evident distress Head: Head normocephalic and atraumatic.  Oropharynx benign. Neck: Supple Cardiovascular: Regular rate and  rhythm, no murmurs Respiratory: Breath sounds clear to auscultation Musculoskeletal: No obvious deformities or scoliosis Skin: No rashes or neurocutaneous lesions  Neurologic Exam Mental Status: Awake and fully alert.  Oriented to place and time. Attention span, concentration, and fund of knowledge subnormal for age. Language is concrete. Intermittent eye contact. Mood and affect appropriate. Cranial Nerves: Fundoscopic exam reveals sharp disc margins.  Pupils equal, briskly reactive to light.  Extraocular movements full without nystagmus. Hearing intact and symmetric to whisper.  Facial sensation intact.  Face tongue, palate move normally and symmetrically. Shoulder shrug normal Motor: Normal bulk and tone. Normal strength in all tested extremity muscles. Sensory: Intact to touch and temperature in all extremities.  Coordination: Rapid alternating movements normal in all extremities.  Finger-to-nose and heel-to shin performed accurately bilaterally.  Romberg negative. Gait and Station: Arises from chair without difficulty.  Stance is normal. Gait demonstrates normal stride length and balance.   Able to heel, toe and tandem walk without difficulty. Reflexes: 1+ and symmetric. Toes downgoing.   Impression: Autism spectrum disorder requiring support (level 1)  Tics of organic origin   Recommendations for plan of care: The patient's previous [facility] records were reviewed. [name] has neither had nor required imaging or lab studies since the last visit. He is a 26 year old man with autism, ADHD, as well as vocal and motor tic disorder. He is taking and tolerating generic Adderall for ADHD and Clonidine for tics. [name] is doing well at this time and I will make no changes in his treatment plan. I will see him back in follow up in 1 year or sooner if needed. He and his mother agreed with the plans made today.  The medication list was reviewed and reconciled. No changes were made in the prescribed  medications today. A complete medication list was provided to the patient.  Return in about 1 year (around 07/20/2022).   Allergies as of 07/20/2021       Reactions   Aripiprazole Other (See Comments)   Causes seizures        Medication List        Accurate as of July 20, 2021 11:59 PM. If you have any questions, ask your nurse or doctor.          acetaminophen 500 MG tablet Commonly known as: TYLENOL Take 1,000 mg by mouth every 6 (six) hours as needed for headache.   amphetamine-dextroamphetamine 20 MG tablet Commonly known as: Adderall Take 1 tablet daily   cloNIDine 0.1 MG tablet Commonly known as: CATAPRES TAKE 1/2 TABLET BY MOUTH IN THE MORNING   loratadine 10 MG tablet Commonly known as: CLARITIN Take 10 mg by mouth daily.      Total time spent with the patient was 25 minutes, of which 50% or more was spent in counseling and coordination of care.  Elveria Rising NP-C [facility] Ph. [phone] Fax [phone]

## 2021-08-07 ENCOUNTER — Telehealth (INDEPENDENT_AMBULATORY_CARE_PROVIDER_SITE_OTHER): Payer: Self-pay | Admitting: Family

## 2021-08-07 DIAGNOSIS — F902 Attention-deficit hyperactivity disorder, combined type: Secondary | ICD-10-CM

## 2021-08-07 MED ORDER — AMPHETAMINE-DEXTROAMPHETAMINE 20 MG PO TABS: ORAL_TABLET | ORAL | 0 refills | Status: DC

## 2021-08-07 NOTE — Telephone Encounter (Signed)
Who's calling (name and relationship to patient) : Berle Mull   Best contact number: [phone]  Provider they see: Elveria Rising  Reason for call:   Call ID:      PRESCRIPTION REFILL ONLY  Name of prescription: Adderall  [facility] [address]

## 2021-08-07 NOTE — Telephone Encounter (Signed)
Sent electronically TG 

## 2021-09-10 ENCOUNTER — Telehealth (INDEPENDENT_AMBULATORY_CARE_PROVIDER_SITE_OTHER): Payer: Self-pay | Admitting: Family

## 2021-09-10 DIAGNOSIS — F902 Attention-deficit hyperactivity disorder, combined type: Secondary | ICD-10-CM

## 2021-09-10 MED ORDER — AMPHETAMINE-DEXTROAMPHETAMINE 20 MG PO TABS: ORAL_TABLET | ORAL | 0 refills | Status: DC

## 2021-09-10 NOTE — Telephone Encounter (Signed)
Rx sent electronically. TG 

## 2021-09-10 NOTE — Telephone Encounter (Signed)
?  Name of who is calling: [name]  ? ?Caller's Relationship to Patient: ?Grandmother  ?Best contact number:[phone] ? ?Provider they GYI:RSWN  ? ?Reason for call:prescription refill  ? ? ? ? ?PRESCRIPTION REFILL ONLY ? ?Name of prescription: ?Adderall  ? ?[facility] [address]  ? ?

## 2021-09-19 MED ORDER — AMPHETAMINE-DEXTROAMPHETAMINE 20 MG PO TABS: ORAL_TABLET | ORAL | 0 refills | Status: DC

## 2021-09-19 NOTE — Telephone Encounter (Signed)
?  Name of who is calling:[name]  ? ?Caller's Relationship to Patient:Grandmother  ? ?Best contact number:[phone] ? ?Provider they STM:HDQQ Goodpasture  ? ?Reason for call:pharmacy stated that they do not have adderall in stock and that [facility] [address] does pt has asked to see if it could be resent to that pharmacy  ? ? ? ? ?PRESCRIPTION REFILL ONLY ? ?Name of prescription:ADDERALL  ? ?Pharmacy:[facility] [address] in [place]  ? ? ?

## 2021-09-19 NOTE — Addendum Note (Signed)
Addended by: Princella Ion on: 09/19/2021 04:16 PM ? ? Modules accepted: Orders ? ?

## 2021-09-19 NOTE — Telephone Encounter (Signed)
Sent electronically TG 

## 2021-11-02 ENCOUNTER — Telehealth (INDEPENDENT_AMBULATORY_CARE_PROVIDER_SITE_OTHER): Payer: Self-pay | Admitting: Family

## 2021-11-02 DIAGNOSIS — F902 Attention-deficit hyperactivity disorder, combined type: Secondary | ICD-10-CM

## 2021-11-02 MED ORDER — AMPHETAMINE-DEXTROAMPHETAMINE 20 MG PO TABS: ORAL_TABLET | ORAL | 0 refills | Status: DC

## 2021-11-02 NOTE — Telephone Encounter (Signed)
  Name of who is calling: [name] Gleason  Caller's Relationship to Patient: Grandmother  Best contact number: [phone]  Provider they see: Blane Ohara  Reason for call: [name]  stated that [facility]ider is needing a refill for  Adderall.     PRESCRIPTION REFILL ONLY  Name of prescription:  Pharmacy:

## 2021-11-02 NOTE — Telephone Encounter (Signed)
Rx sent electronically. TG 

## 2021-12-25 ENCOUNTER — Telehealth (INDEPENDENT_AMBULATORY_CARE_PROVIDER_SITE_OTHER): Payer: Self-pay | Admitting: Family

## 2021-12-25 DIAGNOSIS — F902 Attention-deficit hyperactivity disorder, combined type: Secondary | ICD-10-CM

## 2021-12-25 MED ORDER — AMPHETAMINE-DEXTROAMPHETAMINE 20 MG PO TABS: ORAL_TABLET | ORAL | 0 refills | Status: DC

## 2021-12-25 NOTE — Telephone Encounter (Signed)
Rx sent electronically. TG 

## 2021-12-25 NOTE — Telephone Encounter (Signed)
  Name of who is calling:[name]   Caller's Relationship to Patient:Grandmother   Best contact [phone]   Provider they EXH:BZJI Goodpasture   Reason for call:Medication Refill      PRESCRIPTION REFILL ONLY  Name of prescription:Adderall   Pharmacy:[facility] [address] [place]

## 2022-01-02 ENCOUNTER — Other Ambulatory Visit (INDEPENDENT_AMBULATORY_CARE_PROVIDER_SITE_OTHER): Payer: Self-pay | Admitting: Family

## 2022-01-02 DIAGNOSIS — G2569 Other tics of organic origin: Secondary | ICD-10-CM

## 2022-01-02 MED ORDER — CLONIDINE HCL 0.1 MG PO TABS: ORAL_TABLET | ORAL | 3 refills | Status: DC

## 2022-01-23 ENCOUNTER — Other Ambulatory Visit (INDEPENDENT_AMBULATORY_CARE_PROVIDER_SITE_OTHER): Payer: Self-pay | Admitting: Family

## 2022-01-23 DIAGNOSIS — F902 Attention-deficit hyperactivity disorder, combined type: Secondary | ICD-10-CM

## 2022-01-23 MED ORDER — AMPHETAMINE-DEXTROAMPHETAMINE 20 MG PO TABS: ORAL_TABLET | ORAL | 0 refills | Status: DC

## 2022-01-23 NOTE — Telephone Encounter (Signed)
  Name of who is calling: Sarina Ser Relationship to Patient: Grandmother  Best contact number: [phone]  Provider they see: Elveria Rising  Reason for call: Grandmother is calling to refill Asahel's adderall.      PRESCRIPTION REFILL ONLY  Name of prescription: Adderall  Pharmacy: [facility] [address]

## 2022-01-23 NOTE — Telephone Encounter (Signed)
Rx was sent in electronically. TG 

## 2022-02-22 ENCOUNTER — Telehealth (INDEPENDENT_AMBULATORY_CARE_PROVIDER_SITE_OTHER): Payer: Self-pay | Admitting: Family

## 2022-02-22 DIAGNOSIS — F902 Attention-deficit hyperactivity disorder, combined type: Secondary | ICD-10-CM

## 2022-02-22 MED ORDER — AMPHETAMINE-DEXTROAMPHETAMINE 20 MG PO TABS: ORAL_TABLET | ORAL | 0 refills | Status: DC

## 2022-02-22 NOTE — Telephone Encounter (Signed)
Who's calling (name and relationship to patient) : Ronald Rivera; grandmother  Best contact number: [phone]  Provider they see: Blane Ohara, NP  Reason for call: Ronald Rivera has called in to get refill for Adderall   Call ID:      PRESCRIPTION REFILL ONLY  Name of prescription:  Pharmacy:

## 2022-02-22 NOTE — Telephone Encounter (Signed)
Sent electronically TG 

## 2022-03-26 ENCOUNTER — Telehealth (INDEPENDENT_AMBULATORY_CARE_PROVIDER_SITE_OTHER): Payer: Self-pay | Admitting: Family

## 2022-03-26 DIAGNOSIS — F902 Attention-deficit hyperactivity disorder, combined type: Secondary | ICD-10-CM

## 2022-03-26 MED ORDER — AMPHETAMINE-DEXTROAMPHETAMINE 20 MG PO TABS: ORAL_TABLET | ORAL | 0 refills | Status: DC

## 2022-03-26 NOTE — Telephone Encounter (Signed)
seen 07/20/2021 f/u 08/09/2022

## 2022-03-26 NOTE — Telephone Encounter (Signed)
Who's calling (name and relationship to patient) : [name] Gleason; mom  Best contact number: [phone]  Provider they see: [facility]. NP  Reason for call: Ronald Rivera has called in to get a refill for Adderall.   Call ID:      PRESCRIPTION REFILL ONLY  Name of prescription:  Pharmacy:

## 2022-03-28 ENCOUNTER — Telehealth (INDEPENDENT_AMBULATORY_CARE_PROVIDER_SITE_OTHER): Payer: Self-pay | Admitting: Family

## 2022-03-28 DIAGNOSIS — F902 Attention-deficit hyperactivity disorder, combined type: Secondary | ICD-10-CM

## 2022-03-28 NOTE — Telephone Encounter (Signed)
Contacted pt's pharmacy.  Verified pt's name and DOB as well as pt's name.  I spoke to a representative by the name of Tiffany.    Tiffany stated that they did in fact receive pt's RX that was sent on the 10th of this month. She stated that she put the RX through to be filled and that it would be ready for pickup in a couple of hours.    SS, CCMA

## 2022-03-28 NOTE — Telephone Encounter (Signed)
Who's calling (name and relationship to patient) :  Best contact [phone]  Provider they see: Rockwell Germany  Reason for call: Called 10/10 for refill of Adderall but it hasn't been sent in yet.    Call ID:      PRESCRIPTION REFILL ONLY  Name of prescription: ADDERALL) 20 MG tablet   [id number] [place] [place]  [address], [place] [address]

## 2022-03-28 NOTE — Telephone Encounter (Signed)
The Adderall Rx was sent to the pharmacy on 03/26/22 and it says that it was received by the pharmacy at 4:27pm that day. Caryl Comes, please call the pharmacy to ask if there is a problem filling the prescription. Thanks, Otila Kluver

## 2022-03-28 NOTE — Telephone Encounter (Signed)
Contacted pt's Grandmother. Verified pt's name and DOB as well as Grandmothers name.  Informed pt's grandmother that the pharmacy did in fact receive pt's RX on the 10th and that it should be ready for pickup in a few hours .  Grandmother verbalized understanding of this and thanked [place] for looking into this for her.  SS, CCMA

## 2022-04-29 ENCOUNTER — Telehealth (INDEPENDENT_AMBULATORY_CARE_PROVIDER_SITE_OTHER): Payer: Self-pay | Admitting: Family

## 2022-04-29 DIAGNOSIS — F902 Attention-deficit hyperactivity disorder, combined type: Secondary | ICD-10-CM

## 2022-04-29 MED ORDER — AMPHETAMINE-DEXTROAMPHETAMINE 20 MG PO TABS: ORAL_TABLET | ORAL | 0 refills | Status: DC

## 2022-04-29 NOTE — Telephone Encounter (Signed)
I sent in a prescription. TG

## 2022-04-29 NOTE — Telephone Encounter (Signed)
  Name of who is calling: Sarina Ser Relationship to Patient: Grandmother   Best contact number: [phone]  Provider they see: Elveria Rising   Reason for call: Grandmother is calling to get refill on prescription.      PRESCRIPTION REFILL ONLY  Name of prescription: ADDERALL  [facility]

## 2022-05-31 ENCOUNTER — Other Ambulatory Visit (INDEPENDENT_AMBULATORY_CARE_PROVIDER_SITE_OTHER): Payer: Self-pay | Admitting: Family

## 2022-05-31 DIAGNOSIS — F902 Attention-deficit hyperactivity disorder, combined type: Secondary | ICD-10-CM

## 2022-05-31 MED ORDER — AMPHETAMINE-DEXTROAMPHETAMINE 20 MG PO TABS: ORAL_TABLET | ORAL | 0 refills | Status: DC

## 2022-05-31 NOTE — Telephone Encounter (Signed)
  Name of who is calling: gloria   Caller's Relationship to Patient:   Best contact number: [phone]  Provider they see: Elveria Rising  Reason for call: refill on adderall     PRESCRIPTION REFILL ONLY  Name of prescription: amphetamine-dextroamphetamine (ADDERALL) 20 MG tablet   [id number] [place], [place] - [address]

## 2022-07-01 ENCOUNTER — Telehealth (INDEPENDENT_AMBULATORY_CARE_PROVIDER_SITE_OTHER): Payer: Self-pay | Admitting: Family

## 2022-07-01 DIAGNOSIS — F902 Attention-deficit hyperactivity disorder, combined type: Secondary | ICD-10-CM

## 2022-07-01 MED ORDER — AMPHETAMINE-DEXTROAMPHETAMINE 20 MG PO TABS: ORAL_TABLET | ORAL | 0 refills | Status: DC

## 2022-07-01 NOTE — Telephone Encounter (Signed)
Seen 07/20/2021 follow up 08/09/22

## 2022-07-01 NOTE — Telephone Encounter (Signed)
I sent the Rx to the pharmacy. Thanks, Otila Kluver

## 2022-07-01 NOTE — Telephone Encounter (Signed)
GM notified Rx sent

## 2022-07-01 NOTE — Telephone Encounter (Signed)
  Name of who is calling: Horatio Pel Relationship to Patient: grandmother  Best contact number: [phone]  Provider they see:  Reason for call: Grandmother called to ask for a refill on his adderall. Stated he has only 2 left.      PRESCRIPTION REFILL ONLY  Name of prescription: [place] [address]

## 2022-07-30 ENCOUNTER — Other Ambulatory Visit (INDEPENDENT_AMBULATORY_CARE_PROVIDER_SITE_OTHER): Payer: Self-pay | Admitting: Family

## 2022-07-30 DIAGNOSIS — F902 Attention-deficit hyperactivity disorder, combined type: Secondary | ICD-10-CM

## 2022-07-30 MED ORDER — AMPHETAMINE-DEXTROAMPHETAMINE 20 MG PO TABS: ORAL_TABLET | ORAL | 0 refills | Status: DC

## 2022-07-30 NOTE — Telephone Encounter (Signed)
Rx sent electronically. TG 

## 2022-07-30 NOTE — Telephone Encounter (Signed)
  Name of who is calling: Horatio Pel Relationship to Patient: Grandmother  Best contact number:  [phone]  Provider they see: Rockwell Germany  Reason for call: Grandmother is calling to get refill on prescription.      PRESCRIPTION REFILL ONLY  Name of prescription: ADDERALL   [facility] [place]

## 2022-08-09 ENCOUNTER — Encounter (INDEPENDENT_AMBULATORY_CARE_PROVIDER_SITE_OTHER): Payer: Self-pay | Admitting: Family

## 2022-08-09 ENCOUNTER — Encounter (INDEPENDENT_AMBULATORY_CARE_PROVIDER_SITE_OTHER): Payer: Self-pay

## 2022-08-09 ENCOUNTER — Ambulatory Visit (INDEPENDENT_AMBULATORY_CARE_PROVIDER_SITE_OTHER): Payer: BC Managed Care – PPO | Admitting: Family

## 2022-08-09 VITALS — BP 116/78 | HR 64 | Ht 67.91 in | Wt 131.4 lb

## 2022-08-09 DIAGNOSIS — F840 Autistic disorder: Secondary | ICD-10-CM

## 2022-08-09 DIAGNOSIS — F902 Attention-deficit hyperactivity disorder, combined type: Secondary | ICD-10-CM | POA: Diagnosis not present

## 2022-08-09 DIAGNOSIS — G2569 Other tics of organic origin: Secondary | ICD-10-CM | POA: Diagnosis not present

## 2022-08-09 MED ORDER — CLONIDINE HCL 0.1 MG PO TABS: ORAL_TABLET | ORAL | 3 refills | Status: DC

## 2022-08-09 NOTE — Patient Instructions (Signed)
It was a pleasure to see you today!  Instructions for you until your next appointment are as follows: Continue taking your medications as prescribed Let me know if you have any questions or concerns Please sign up for MyChart if you have not done so. Please plan to return for follow up in one year or sooner if needed.  Feel free to contact our office during normal business hours at [phone] with questions or concerns. If there is no answer or the call is outside business hours, please leave a message and our clinic staff will call you back within the next business day.  If you have an urgent concern, please stay on the line for our after-hours answering service and ask for the on-call neurologist.     I also encourage you to use MyChart to communicate with me more directly. If you have not yet signed up for MyChart within [facility], the front desk staff can help you. However, please note that this inbox is NOT monitored on nights or weekends, and response can take up to 2 business days.  Urgent matters should be discussed with the on-call pediatric neurologist.   At [facility], we are committed to providing exceptional care. You will receive a patient satisfaction survey through text or email regarding your visit today. Your opinion is important to me. Comments are appreciated.

## 2022-08-09 NOTE — Progress Notes (Signed)
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 He has attention deficit hyperactivity disorder, combined type, and a well-controlled vocal and motor tic disorder. He takes generic Adderal for ADHD and Clonidine for tics.    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 He reports that the Adderall is working well to help focus his attention during the work day.  Tics are not problematic. His mother occasionally notes that he stutters when he is excited.  Ronald Rivera has been otherwise generally healthy since he was last seen. No health concerns today other than previously mentioned.  Review of systems: Please see HPI for neurologic and other pertinent review of systems. Otherwise all other systems were reviewed and were negative.  Problem List: Patient Active Problem List   Diagnosis Date Noted   Autism spectrum disorder requiring support (level 1) 08/02/2015   Colitis 09/27/2014   Nasal congestion 11/11/2013   Conjunctivitis unspecified 11/11/2013   Other specified pervasive developmental disorders, current or active state 12/31/2012   Visit for preventive health examination 04/17/2012   Wears glasses 04/17/2012   History of allergy 03/12/2012   Acne 01/12/2011   ADHD (attention deficit hyperactivity disorder)    Tics of organic origin    NECK PAIN 03/10/2007     Past Medical History:  Diagnosis Date   ADHD (attention deficit hyperactivity disorder)    Autistic spectrum disorder    sees Dr Gaynell Face   Hx of varicella    Left acute  otitis media 03/12/2012   STREPTOCOCCAL PHARYNGITIS 04/03/2010   Qualifier: Diagnosis of  By: Regis Bill MD, Standley Brooking    Tics of organic origin    hx of    Past medical history comments: See HPI  Surgical history: No past surgical history on file.   Family history: family history includes Cancer in his paternal grandmother.   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   He is currently employed with [employer].   He lives with his grandparents.    He enjoys playing the Xbox and working.    Social Determinants of Health   Financial Resource Strain: Not on file  Food Insecurity: Not on file  Transportation Needs: Not on file  Physical Activity: Not on file  Stress: Not on file  Social Connections: Not on file  Intimate Partner Violence: Not on file    Past/failed meds:   Allergies: Allergies  Allergen Reactions   Aripiprazole Other (See Comments)    Causes seizures  Immunizations: Immunization History  Administered Date(s) Administered   Td 02/13/2007   Tdap 04/18/2017     Diagnostics/Screenings:   Physical Exam: BP 116/78   Pulse 64   Ht 5' 7.91" (1.725 m)   Wt 131 lb 6.4 oz (59.6 kg)   BMI 20.03 kg/m   General: Well developed, well nourished young man, seated in exam room, in no evident distress Head: Head normocephalic and atraumatic.  Oropharynx benign. Neck: Supple Cardiovascular: Regular rate and rhythm, no murmurs Respiratory: Breath sounds clear to  auscultation Musculoskeletal: No obvious deformities or scoliosis Skin: No rashes or neurocutaneous lesions  Neurologic Exam Mental Status: Awake and fully alert.  Oriented to place and time. Variable eye contact. Language is concrete. Speech is fluent. Mood and affect appropriate. Cranial Nerves: Fundoscopic exam reveals sharp disc margins.  Pupils equal, briskly reactive to light.  Extraocular movements full without nystagmus. Hearing intact and symmetric to whisper.  Facial sensation intact.  Face tongue, palate move normally and symmetrically. Shoulder shrug normal Motor: Normal bulk and tone. Normal strength in all tested extremity muscles. Sensory: Intact to touch and temperature in all extremities.  Coordination: Rapid alternating movements normal in all extremities.  Finger-to-nose and heel-to shin performed accurately bilaterally.  Romberg negative. Gait and Station: Arises from chair without difficulty.  Stance is normal. Gait demonstrates normal stride length and balance.   Able to heel, toe and tandem walk without difficulty. Reflexes: 1+ and symmetric. Toes downgoing.   Impression: Attention deficit hyperactivity disorder (ADHD), combined type  Autism spectrum disorder requiring support (level 1)  Tics of organic origin   Recommendations for plan of care: The patient's previous Epic records were reviewed. No recent diagnostic studies to be reviewed with the patient. He is currently doing well on his current medication regimen. Plan until next visit: Continue medications as prescribed  Call for questions or concerns Return in about 1 year (around 08/10/2023).  The medication list was reviewed and reconciled. No changes were made in the prescribed medications today. A complete medication list was provided to the patient.  Allergies as of 08/09/2022       Reactions   Aripiprazole Other (See Comments)   Causes seizures        Medication List        Accurate as of  August 09, 2022  8:23 AM. If you have any questions, ask your nurse or doctor.          acetaminophen 500 MG tablet Commonly known as: TYLENOL Take 1,000 mg by mouth every 6 (six) hours as needed for headache.   amphetamine-dextroamphetamine 20 MG tablet Commonly known as: Adderall Take 1 tablet daily   cloNIDine 0.1 MG tablet Commonly known as: CATAPRES TAKE 1/2 TABLET BY MOUTH IN THE MORNING   loratadine 10 MG tablet Commonly known as: CLARITIN Take 10 mg by mouth daily.      Total time spent with the patient was 20 minutes, of which 50% or more was spent in counseling and coordination of care.  Rockwell Germany NP-C [facility] [id number], [place], [place] [address] Ph. [phone] Fax [phone]

## 2022-09-02 ENCOUNTER — Other Ambulatory Visit (INDEPENDENT_AMBULATORY_CARE_PROVIDER_SITE_OTHER): Payer: Self-pay | Admitting: Family

## 2022-09-02 DIAGNOSIS — F902 Attention-deficit hyperactivity disorder, combined type: Secondary | ICD-10-CM

## 2022-09-02 MED ORDER — AMPHETAMINE-DEXTROAMPHETAMINE 20 MG PO TABS: ORAL_TABLET | ORAL | 0 refills | Status: DC

## 2022-09-02 NOTE — Telephone Encounter (Signed)
Rx sent electronically. TG 

## 2022-09-02 NOTE — Telephone Encounter (Signed)
  Name of who is calling: Horatio Pel Relationship to Patient: Grandmother  Best contact number: [phone]  Provider they see: Cloretta Ned  Reason for call: Needs a refill adderall 20 MG. Janice has two pills left     PRESCRIPTION REFILL ONLY  Name of prescription:   Pharmacy: [address], [place] [facility]

## 2022-09-02 NOTE — Telephone Encounter (Signed)
Adderall Last OV 07/18/2022 Next OV 07/23/2023 Rx written 07/18/2022

## 2022-09-03 ENCOUNTER — Telehealth (INDEPENDENT_AMBULATORY_CARE_PROVIDER_SITE_OTHER): Payer: Self-pay | Admitting: Family

## 2022-09-03 NOTE — Telephone Encounter (Signed)
[facility]. Please let grandmother know. Thanks, Otila Kluver

## 2022-09-03 NOTE — Telephone Encounter (Addendum)
  Name of who is calling:[name]   Caller's Relationship to Patient:Grandmother   Best contact [phone]   Provider they ON:[id number] [place]   Reason for call:medication refill, out of medication. Grandmother asked for a call back once its sent so she can go pick it up.     PRESCRIPTION REFILL ONLY  Name of prescription:ADDERALL   [facility] [place] [place]

## 2022-09-05 NOTE — Telephone Encounter (Signed)
[facility] was [facility] 2 days ago

## 2022-10-02 ENCOUNTER — Other Ambulatory Visit (INDEPENDENT_AMBULATORY_CARE_PROVIDER_SITE_OTHER): Payer: Self-pay | Admitting: Family

## 2022-10-02 DIAGNOSIS — F902 Attention-deficit hyperactivity disorder, combined type: Secondary | ICD-10-CM

## 2022-10-02 MED ORDER — AMPHETAMINE-DEXTROAMPHETAMINE 20 MG PO TABS: ORAL_TABLET | ORAL | 0 refills | Status: DC

## 2022-10-02 NOTE — Telephone Encounter (Signed)
Who's calling (name and relationship to patient) : [name] Gleason  Best contact number: [phone]  Provider they see: Elveria Rising  Reason for call: Medication Refill   Call ID: Pt needs med refill on Adderall, would like a call when medication is called in [facility], pt is down to 1 pill.      PRESCRIPTION REFILL ONLY  Name of prescription: Adderall  [facility] [address] [address]

## 2022-11-04 ENCOUNTER — Other Ambulatory Visit (INDEPENDENT_AMBULATORY_CARE_PROVIDER_SITE_OTHER): Payer: Self-pay | Admitting: Family

## 2022-11-04 DIAGNOSIS — F902 Attention-deficit hyperactivity disorder, combined type: Secondary | ICD-10-CM

## 2022-11-04 MED ORDER — AMPHETAMINE-DEXTROAMPHETAMINE 20 MG PO TABS: ORAL_TABLET | ORAL | 0 refills | Status: DC

## 2022-11-04 NOTE — Telephone Encounter (Signed)
Who's calling (name and relationship to patient) : [name] Gleason; mom   Best contact number: [phone]  Provider they see: Seward Carol  Reason for call: Mom called in to get a Rx refill for adderall(the generic).  Mom would like a call back once it has been sent in, [facility] stated [facility] once [facility] ready.   Call ID:      PRESCRIPTION REFILL ONLY  Name of prescription:  Pharmacy:

## 2022-12-03 ENCOUNTER — Other Ambulatory Visit (INDEPENDENT_AMBULATORY_CARE_PROVIDER_SITE_OTHER): Payer: Self-pay | Admitting: Family

## 2022-12-03 DIAGNOSIS — F902 Attention-deficit hyperactivity disorder, combined type: Secondary | ICD-10-CM

## 2022-12-03 MED ORDER — AMPHETAMINE-DEXTROAMPHETAMINE 20 MG PO TABS: ORAL_TABLET | ORAL | 0 refills | Status: DC

## 2022-12-03 NOTE — Telephone Encounter (Signed)
Who's calling (name and relationship to patient) : [name] Gleason; Grandmother  Best contact number: [phone]  Provider they see: Elveria Rising, NP   Reason for call: Called in to get a refill for adderrall    Call ID:      PRESCRIPTION REFILL ONLY  Name of prescription:  Pharmacy:

## 2023-01-01 ENCOUNTER — Other Ambulatory Visit (INDEPENDENT_AMBULATORY_CARE_PROVIDER_SITE_OTHER): Payer: Self-pay | Admitting: Family

## 2023-01-01 DIAGNOSIS — F902 Attention-deficit hyperactivity disorder, combined type: Secondary | ICD-10-CM

## 2023-01-01 MED ORDER — AMPHETAMINE-DEXTROAMPHETAMINE 20 MG PO TABS: ORAL_TABLET | ORAL | 0 refills | Status: DC

## 2023-01-01 NOTE — Telephone Encounter (Signed)
  Name of who is calling: Sarina Ser Relationship to Patient: [facility]  Best contact number: [phone]  Provider they see: Elveria Rising  Reason for call: Mom called to get refill on prescription. She's requesting a callback with update once it has been sent.      PRESCRIPTION REFILL ONLY  Name of prescription: ADDERALL   [facility] [address] [place]

## 2023-01-31 ENCOUNTER — Other Ambulatory Visit (INDEPENDENT_AMBULATORY_CARE_PROVIDER_SITE_OTHER): Payer: Self-pay | Admitting: Family

## 2023-01-31 DIAGNOSIS — F902 Attention-deficit hyperactivity disorder, combined type: Secondary | ICD-10-CM

## 2023-01-31 MED ORDER — AMPHETAMINE-DEXTROAMPHETAMINE 20 MG PO TABS: ORAL_TABLET | ORAL | 0 refills | Status: DC

## 2023-01-31 NOTE — Telephone Encounter (Signed)
[name]( grandmother) has called back in to follow up on Rx refill, she stated he has only enough for the next 2 days.

## 2023-01-31 NOTE — Telephone Encounter (Signed)
  Name of who is calling: [name] Gleason  Caller's Relationship to Patient: Olene Floss  Best contact number: [phone]  Provider they see: Inetta Fermo  Reason for call: Called to get refill on adderall medication      PRESCRIPTION REFILL ONLY  Name of prescription: Adderall  [id number] [address]

## 2023-01-31 NOTE — Telephone Encounter (Signed)
Rx sent electronically. TG 

## 2023-01-31 NOTE — Telephone Encounter (Signed)
Contacted patients grandmother.  Informed grandmother that we have received the refill request, we're just waiting on the providers approval.   Grandmother asked if the request would be sent today, I could not guarantee but I informed the grandmother that the RX would more than likely be sent today.   SS, CCMA

## 2023-03-02 ENCOUNTER — Emergency Department (HOSPITAL_BASED_OUTPATIENT_CLINIC_OR_DEPARTMENT_OTHER): Payer: BC Managed Care – PPO

## 2023-03-02 ENCOUNTER — Other Ambulatory Visit: Payer: Self-pay

## 2023-03-02 ENCOUNTER — Emergency Department (HOSPITAL_BASED_OUTPATIENT_CLINIC_OR_DEPARTMENT_OTHER)
Admission: EM | Admit: 2023-03-02 | Discharge: 2023-03-02 | Disposition: A | Discharge status: Home / Self Care | Payer: BC Managed Care – PPO

## 2023-03-02 DIAGNOSIS — K529 Noninfective gastroenteritis and colitis, unspecified: Secondary | ICD-10-CM | POA: Insufficient documentation

## 2023-03-02 DIAGNOSIS — F840 Autistic disorder: Secondary | ICD-10-CM | POA: Insufficient documentation

## 2023-03-02 DIAGNOSIS — Z20822 Contact with and (suspected) exposure to covid-19: Secondary | ICD-10-CM | POA: Insufficient documentation

## 2023-03-02 DIAGNOSIS — R112 Nausea with vomiting, unspecified: Secondary | ICD-10-CM | POA: Diagnosis present

## 2023-03-02 LAB — CBC WITH DIFFERENTIAL/PLATELET
Abs Immature Granulocytes: 0.03 10*3/uL (ref 0.00–0.07)
Basophils Absolute: 0 10*3/uL (ref 0.0–0.1)
Basophils Relative: 0 %
Eosinophils Absolute: 0 10*3/uL (ref 0.0–0.5)
Eosinophils Relative: 0 %
HCT: 45.9 % (ref 39.0–52.0)
Hemoglobin: 15.7 g/dL (ref 13.0–17.0)
Immature Granulocytes: 0 %
Lymphocytes Relative: 6 %
Lymphs Abs: 0.6 10*3/uL — ABNORMAL LOW (ref 0.7–4.0)
MCH: 29.3 pg (ref 26.0–34.0)
MCHC: 34.2 g/dL (ref 30.0–36.0)
MCV: 85.8 fL (ref 80.0–100.0)
Monocytes Absolute: 0.9 10*3/uL (ref 0.1–1.0)
Monocytes Relative: 9 %
Neutro Abs: 8 10*3/uL — ABNORMAL HIGH (ref 1.7–7.7)
Neutrophils Relative %: 85 %
Platelets: 223 10*3/uL (ref 150–400)
RBC: 5.35 MIL/uL (ref 4.22–5.81)
RDW: 12.4 % (ref 11.5–15.5)
WBC: 9.5 10*3/uL (ref 4.0–10.5)
nRBC: 0 % (ref 0.0–0.2)

## 2023-03-02 LAB — SARS CORONAVIRUS 2 BY RT PCR: SARS Coronavirus 2 by RT PCR: NEGATIVE

## 2023-03-02 LAB — COMPREHENSIVE METABOLIC PANEL
ALT: 16 U/L (ref 0–44)
AST: 23 U/L (ref 15–41)
Albumin: 4.3 g/dL (ref 3.5–5.0)
Alkaline Phosphatase: 53 U/L (ref 38–126)
Anion gap: 9 (ref 5–15)
BUN: 17 mg/dL (ref 6–20)
CO2: 25 mmol/L (ref 22–32)
Calcium: 9.2 mg/dL (ref 8.9–10.3)
Chloride: 101 mmol/L (ref 98–111)
Creatinine, Ser: 0.92 mg/dL (ref 0.61–1.24)
GFR, Estimated: >60 mL/min (ref >60)
Glucose, Bld: 112 mg/dL — ABNORMAL HIGH (ref 70–99)
Potassium: 4.7 mmol/L (ref 3.5–5.1)
Sodium: 135 mmol/L (ref 135–145)
Total Bilirubin: 0.5 mg/dL (ref 0.3–1.2)
Total Protein: 7.1 g/dL (ref 6.5–8.1)

## 2023-03-02 LAB — LIPASE, BLOOD: Lipase: 19 U/L (ref 11–51)

## 2023-03-02 MED ORDER — IOHEXOL 300 MG/ML  SOLN
100.0000 mL | Freq: Once | INTRAMUSCULAR | Status: AC | PRN
  Administered 2023-03-02: 100 mL via INTRAVENOUS

## 2023-03-02 MED ORDER — AMOXICILLIN-POT CLAVULANATE 875-125 MG PO TABS: 1.0000 | ORAL_TABLET | Freq: Two times a day (BID) | ORAL | 0 refills | Status: DC

## 2023-03-02 MED ORDER — SODIUM CHLORIDE 0.9 % IV BOLUS
1000.0000 mL | Freq: Once | INTRAVENOUS | Status: AC
  Administered 2023-03-02: 1000 mL via INTRAVENOUS

## 2023-03-02 MED ORDER — ONDANSETRON HCL 4 MG PO TABS: 4.0000 mg | ORAL_TABLET | Freq: Four times a day (QID) | ORAL | 0 refills | Status: AC

## 2023-03-02 MED ORDER — MORPHINE SULFATE (PF) 4 MG/ML IV SOLN
4.0000 mg | Freq: Once | INTRAVENOUS | Status: AC
  Administered 2023-03-02: 4 mg via INTRAVENOUS
  Filled 2023-03-02: qty 1

## 2023-03-02 NOTE — ED Notes (Signed)
Pt was unable to give urine sample at this time. Pt is aware urine needs to be collected.

## 2023-03-02 NOTE — Discharge Instructions (Signed)
Please follow-up with your primary care provider regarding recent symptoms and ER visit.  Today your labs and imaging are reassuring and show they have a viral illness causing your symptoms.  You may use the Zofran I have prescribed for you to help with your nausea vomiting and you may use Imodium over-the-counter to help with the diarrhea.  Please remain hydrated and eat food as tolerated including bananas, rice, applesauce, toast.  I have prescribed for you an antibiotic to take your symptoms are still persistent day 10.  If you are still experiencing symptoms at this point please return to the ER for further evaluation.  If symptoms change or worsen please return to the ER.

## 2023-03-02 NOTE — ED Provider Notes (Signed)
[place] EMERGENCY DEPARTMENT AT [facility] Provider Note   CSN: [id number] Arrival date & time: 03/02/23  1340     History  Chief Complaint  Patient presents with   Abdominal Pain   Diarrhea   Emesis    Ronald Rivera is a 27 y.o. male history of autism presented with nausea vomiting diarrhea for the past 3 days.  Patient states that the pain has been 8 out of 10 and he has not been able to eat or drink due to the nausea and vomiting.  Patient does endorse a fever of 102 Fahrenheit last night that did resolve with Tylenol.  Patient states the pain is mostly in his right lower quadrant and that he still has his appendix.  Patient denies testicle pain, dysuria, hematuria, leg swelling, chest pain, shortness of breath.  Patient states the pain started after eating out at a restaurant 3 days ago.  Patient went to urgent care and was referred here for further workup.    Home Medications Prior to Admission medications   Medication Sig Start Date End Date Taking? Authorizing Provider  amoxicillin-clavulanate (AUGMENTIN) 875-125 MG tablet Take 1 tablet by mouth every 12 (twelve) hours. 03/02/23  Yes Lavaris Sexson, Beverly Gust, PA-C  amphetamine-dextroamphetamine (ADDERALL) 20 MG tablet Take 20 mg by mouth daily. 01/31/23  Yes [provider]  cloNIDine (CATAPRES) 0.1 MG tablet Take 0.05 mg by mouth every morning. 12/29/22  Yes [provider]  ondansetron (ZOFRAN) 4 MG tablet Take 1 tablet (4 mg total) by mouth every 6 (six) hours. 03/02/23  Yes Netta Corrigan, PA-C      Allergies    Abilify [aripiprazole]    Review of Systems   Review of Systems  Gastrointestinal:  Positive for abdominal pain, diarrhea and vomiting.    Physical Exam Updated Vital Signs BP 128/76 (BP Location: Right Arm)   Pulse 74   Temp 99.5 F (37.5 C) (Oral)   Resp 20   Ht 5\' 10"  (1.778 m)   Wt 61.2 kg   SpO2 100%   BMI 19.37 kg/m  Physical Exam Vitals reviewed.  Constitutional:       General: He is not in acute distress. HENT:     Head: Normocephalic and atraumatic.  Eyes:     Extraocular Movements: Extraocular movements intact.     Conjunctiva/sclera: Conjunctivae normal.     Pupils: Pupils are equal, round, and reactive to light.  Cardiovascular:     Rate and Rhythm: Normal rate and regular rhythm.     Pulses: Normal pulses.     Heart sounds: Normal heart sounds.     Comments: 2+ bilateral radial/dorsalis pedis pulses with regular rate Pulmonary:     Effort: Pulmonary effort is normal. No respiratory distress.     Breath sounds: Normal breath sounds.  Abdominal:     Palpations: Abdomen is soft.     Tenderness: There is abdominal tenderness in the right lower quadrant and suprapubic area. There is no guarding or rebound. Negative signs include Murphy's sign, Rovsing's sign, McBurney's sign and psoas sign.  Genitourinary:    Testes:        Right: Swelling not present.        Left: Swelling not present.  Musculoskeletal:        General: Normal range of motion.     Cervical back: Normal range of motion and neck supple.     Comments: 5 out of 5 bilateral grip/leg extension strength  Skin:  General: Skin is warm and dry.     Capillary Refill: Capillary refill takes less than 2 seconds.  Neurological:     General: No focal deficit present.     Mental Status: He is alert and oriented to person, place, and time.     Comments: Sensation intact in all 4 limbs  Psychiatric:        Mood and Affect: Mood normal.     ED Results / Procedures / Treatments   Labs (all labs ordered are listed, but only abnormal results are displayed) Labs Reviewed  CBC WITH DIFFERENTIAL/PLATELET - Abnormal; Notable for the following components:      Result Value   Neutro Abs 8.0 (*)    Lymphs Abs 0.6 (*)    All other components within normal limits  COMPREHENSIVE METABOLIC PANEL - Abnormal; Notable for the following components:   Glucose, Bld 112 (*)    All other components  within normal limits  SARS CORONAVIRUS 2 BY RT PCR  LIPASE, BLOOD  URINALYSIS, ROUTINE W REFLEX MICROSCOPIC    EKG None  Radiology CT ABDOMEN PELVIS W CONTRAST  Result Date: 03/02/2023 CLINICAL DATA:  Right lower quadrant abdominal pain. EXAM: CT ABDOMEN AND PELVIS WITH CONTRAST TECHNIQUE: Multidetector CT imaging of the abdomen and pelvis was performed using the standard protocol following bolus administration of intravenous contrast. RADIATION DOSE REDUCTION: This exam was performed according to the departmental dose-optimization program which includes automated exposure control, adjustment of the mA and/or kV according to patient size and/or use of iterative reconstruction technique. CONTRAST:  OMNIPAQUE IOHEXOL 300 MG/ML  SOLN COMPARISON:  CT abdomen pelvis dated 09/13/2014. FINDINGS: Lower chest: No acute abnormality. Hepatobiliary: No focal liver abnormality is seen. No gallstones, gallbladder wall thickening, or biliary dilatation. There is mild periportal edema. Pancreas: Unremarkable. No pancreatic ductal dilatation or surrounding inflammatory changes. Spleen: Normal in size without focal abnormality. Adrenals/Urinary Tract: Adrenal glands are unremarkable. Kidneys are normal, without renal calculi, focal lesion, or hydronephrosis. Bladder is unremarkable. Stomach/Bowel: Stomach is within normal limits. There is bowel wall thickening of the descending colon and splenic flexure. The appendix is normal. No evidence of bowel obstruction. Vascular/Lymphatic: No significant vascular findings are present. No enlarged abdominal or pelvic lymph nodes. Reproductive: Prostate is unremarkable. Other: No abdominal wall hernia or abnormality. No abdominopelvic ascites. Musculoskeletal: No acute or significant osseous findings. IMPRESSION: 1. Bowel wall thickening of the descending colon and splenic flexure is consistent with colitis. 2. Mild periportal edema is nonspecific and may be related to fluid  resuscitation. Electronically Signed   By: Romona Curls M.D.   On: 03/02/2023 15:34    Procedures Procedures    Medications Ordered in ED Medications  sodium chloride 0.9 % bolus 1,000 mL (1,000 mLs Intravenous New Bag/Given 03/02/23 1430)  morphine (PF) 4 MG/ML injection 4 mg (4 mg Intravenous Given 03/02/23 1431)  iohexol (OMNIPAQUE) 300 MG/ML solution 100 mL (100 mLs Intravenous Contrast Given 03/02/23 1516)    ED Course/ Medical Decision Making/ A&P                                 Medical Decision Making Amount and/or Complexity of Data Reviewed Labs: ordered. Radiology: ordered.  Risk Prescription drug management.   Pauline Aus 27 y.o. presented today for abdominal pain. Working DDx that I considered at this time includes, but not limited to, gastroenteritis, colitis, small bowel obstruction, appendicitis, cholecystitis, hepatobiliary pathology, gastritis,  PUD, ACS, aortic dissection pancreatitis, nephrolithiasis, AAA, UTI, pyelonephritis, testicular torsion.  R/o DDx: small bowel obstruction, appendicitis, cholecystitis, hepatobiliary pathology, gastritis, PUD, ACS, aortic dissection pancreatitis, nephrolithiasis, AAA, UTI, pyelonephritis, testicular torsion: These are considered less likely due to history of present illness, physical exam, labs/imaging findings.  Review of prior external notes: 10/27/2019 telephone  Unique Tests and My Interpretation:  CBC with differential: Unremarkable CMP: Unremarkable Lipase: Unremarkable CT Abd/Pelvis with contrast: Colitis  Discussion with Independent Historian:  Mother  Discussion of Management of Tests: None  Risk: Medium: prescription drug management  Risk Stratification Score: None  Plan: On exam patient was in no acute distress with stable vitals.  Patient did have tenderness to his right lower quadrant along with suprapubic region but otherwise had unremarkable physical exam.  Patient states he still has his  appendix and given his endorsement of right lower quadrant pain and tenderness we will proceed with labs and CT imaging to rule out appendicitis.  Patient is also endorsing nausea vomiting diarrhea which could also indicative of a viral illness and some COVID was ordered as well.  Patient given fluids and morphine for his symptoms as he was given Zofran at the urgent care and does not require any at this time.  Patient's labs are reassuring.  Patient's CT scan does show colitis and in the setting of symptoms occurring after eating out I suspect this is viral as it is only been 3 days as well.  I spoke about these findings to the patient and his mom and will discharge on Zofran and encouraged Imodium over-the-counter for the diarrhea.  I encouraged the patient to remain hydrated and to eat a brat diet and to follow-up with his primary care provider.  I also printed a prescription of Augmentin for the patient to fill if he is still having symptoms at day 10 as noted to be longer than expected for viral illness.  Patient given work note at his request.  Patient was given return precautions. Patient stable for discharge at this time.  Patient verbalized understanding of plan.         Final Clinical Impression(s) / ED Diagnoses Final diagnoses:  Colitis    Rx / DC Orders ED Discharge Orders          Ordered    ondansetron (ZOFRAN) 4 MG tablet  Every 6 hours        03/02/23 1547    amoxicillin-clavulanate (AUGMENTIN) 875-125 MG tablet  Every 12 hours        03/02/23 1547              Netta Corrigan, PA-C 03/02/23 1551    Coral Spikes, DO 03/06/23 [phone]

## 2023-03-02 NOTE — ED Triage Notes (Signed)
Patient arrives with complaints of vomiting, diarrhea, and abdominal pain x3 days. Patient states that he at a restaurant a few days ago and the symptoms started after that day. Sent here from Urgent Care for further workup.

## 2023-03-03 ENCOUNTER — Other Ambulatory Visit (INDEPENDENT_AMBULATORY_CARE_PROVIDER_SITE_OTHER): Payer: Self-pay | Admitting: Family

## 2023-03-03 ENCOUNTER — Encounter (INDEPENDENT_AMBULATORY_CARE_PROVIDER_SITE_OTHER): Payer: Self-pay | Admitting: Family

## 2023-03-03 MED ORDER — AMPHETAMINE-DEXTROAMPHETAMINE 20 MG PO TABS: 20.0000 mg | ORAL_TABLET | Freq: Every day | ORAL | 0 refills | Status: DC

## 2023-03-03 NOTE — Telephone Encounter (Signed)
  Name of who is calling: [name] Gleason  Caller's Relationship to Patient: Grandmother  Best contact number: [phone]  Provider they see: Elveria Rising  Reason for call: Pt needs med refill on adderall      PRESCRIPTION REFILL ONLY  Name of prescription: Adderall  [facility] [address]

## 2023-04-07 ENCOUNTER — Other Ambulatory Visit (INDEPENDENT_AMBULATORY_CARE_PROVIDER_SITE_OTHER): Payer: Self-pay | Admitting: Family

## 2023-04-07 DIAGNOSIS — F902 Attention-deficit hyperactivity disorder, combined type: Secondary | ICD-10-CM

## 2023-04-07 MED ORDER — AMPHETAMINE-DEXTROAMPHETAMINE 20 MG PO TABS: ORAL_TABLET | ORAL | 0 refills | Status: DC

## 2023-04-07 NOTE — Telephone Encounter (Signed)
Who's calling (name and relationship to patient) : Ronald Rivera, grandmother  Best contact number: [phone]  Provider they see: Elveria Rising, NP  Reason for call: [name] called in to get Adderall refilled.    Call ID:      PRESCRIPTION REFILL ONLY  Name of prescription:  Pharmacy:

## 2023-05-08 ENCOUNTER — Other Ambulatory Visit (INDEPENDENT_AMBULATORY_CARE_PROVIDER_SITE_OTHER): Payer: Self-pay | Admitting: Family

## 2023-05-08 DIAGNOSIS — F902 Attention-deficit hyperactivity disorder, combined type: Secondary | ICD-10-CM

## 2023-05-08 MED ORDER — AMPHETAMINE-DEXTROAMPHETAMINE 20 MG PO TABS: ORAL_TABLET | ORAL | 0 refills | Status: DC

## 2023-05-08 NOTE — Telephone Encounter (Signed)
Sent electronically TG 

## 2023-05-08 NOTE — Telephone Encounter (Signed)
  Name of who is calling: Sarina Ser Relationship to Patient: Olene Floss   Best contact number: [phone]  Provider they see: Inetta Fermo   Reason for call: Calling about refill on adderall medication.     PRESCRIPTION REFILL ONLY  Name of prescription: adderall  [facility] [address]

## 2023-06-05 ENCOUNTER — Other Ambulatory Visit (INDEPENDENT_AMBULATORY_CARE_PROVIDER_SITE_OTHER): Payer: Self-pay | Admitting: Family

## 2023-06-05 DIAGNOSIS — F902 Attention-deficit hyperactivity disorder, combined type: Secondary | ICD-10-CM

## 2023-06-05 MED ORDER — AMPHETAMINE-DEXTROAMPHETAMINE 20 MG PO TABS
ORAL_TABLET | ORAL | 0 refills | Status: DC
Start: 2023-06-05 — End: 2023-07-07

## 2023-06-05 NOTE — Telephone Encounter (Signed)
  Name of who is calling: [facility] Gleason  Caller's Relationship to Patient: Olene Floss  Best contact number: [phone]  Provider they see: Elveria Rising  Reason for call: Pt needs mediation refill.      PRESCRIPTION REFILL ONLY  Name of prescription: ADDERALL 20mg   [facility] [address]

## 2023-07-07 ENCOUNTER — Other Ambulatory Visit (INDEPENDENT_AMBULATORY_CARE_PROVIDER_SITE_OTHER): Payer: Self-pay | Admitting: Family

## 2023-07-07 DIAGNOSIS — F902 Attention-deficit hyperactivity disorder, combined type: Secondary | ICD-10-CM

## 2023-07-07 MED ORDER — AMPHETAMINE-DEXTROAMPHETAMINE 20 MG PO TABS
ORAL_TABLET | ORAL | 0 refills | Status: DC
Start: 2023-07-07 — End: 2023-08-15

## 2023-07-07 NOTE — Telephone Encounter (Signed)
Rx sent electronically. TG 

## 2023-07-07 NOTE — Telephone Encounter (Signed)
Who's calling (name and relationship to patient) : [name] Gleason; grandmother  Best contact number: [phone]  Provider they WCB:[facility] [place]   Reason for call: [name] called in to get Rx refill for Adderall    Call ID:      PRESCRIPTION REFILL ONLY  Name of prescription:  Pharmacy:

## 2023-08-05 ENCOUNTER — Telehealth (INDEPENDENT_AMBULATORY_CARE_PROVIDER_SITE_OTHER): Payer: Self-pay | Admitting: Family

## 2023-08-05 MED ORDER — AMPHETAMINE-DEXTROAMPHETAMINE 20 MG PO TABS: 20.0000 mg | ORAL_TABLET | Freq: Every day | ORAL | 0 refills | Status: DC

## 2023-08-05 NOTE — Telephone Encounter (Signed)
Who's calling (name and relationship to patient) : [name] Gleason grandmother  Best contact number: [phone]  Provider they see: Elveria Rising   Reason for call: refill on rx    Call ID:      PRESCRIPTION REFILL ONLY  Name of prescription:   Pharmacy:

## 2023-08-07 ENCOUNTER — Encounter: Payer: BC Managed Care – PPO | Admitting: Internal Medicine

## 2023-08-08 NOTE — Telephone Encounter (Signed)
Contacted patients mother.  Verified patients name and DOB as well as mothers name.   Mom stated that when she contact the pharmacy and she believes that they dont know what the heck they're doing. Mom stated that the next refill will be sent to a different pharmacy.   I informed mom that I did speak to the pharmacist who stated that they do have the RX and that they were getting the RX ready for her.  Ms. [name] verbalized understanding of this.   SS, CCMA

## 2023-08-08 NOTE — Telephone Encounter (Signed)
Ronald Rivera called back and would like to know when the aderrall prescription will be sent in says pt only has 2 or three pills left. [phone].

## 2023-08-15 ENCOUNTER — Encounter (INDEPENDENT_AMBULATORY_CARE_PROVIDER_SITE_OTHER): Payer: Self-pay | Admitting: Family

## 2023-08-15 ENCOUNTER — Ambulatory Visit (INDEPENDENT_AMBULATORY_CARE_PROVIDER_SITE_OTHER): Payer: BC Managed Care – PPO | Admitting: Family

## 2023-08-15 VITALS — BP 122/62 | Ht 68.11 in | Wt 127.4 lb

## 2023-08-15 DIAGNOSIS — G2569 Other tics of organic origin: Secondary | ICD-10-CM

## 2023-08-15 DIAGNOSIS — F840 Autistic disorder: Secondary | ICD-10-CM

## 2023-08-15 DIAGNOSIS — F902 Attention-deficit hyperactivity disorder, combined type: Secondary | ICD-10-CM

## 2023-08-15 MED ORDER — CLONIDINE HCL 0.1 MG PO TABS: ORAL_TABLET | ORAL | 3 refills | Status: DC

## 2023-08-15 MED ORDER — AMPHETAMINE-DEXTROAMPHETAMINE 20 MG PO TABS: ORAL_TABLET | ORAL | 0 refills | Status: DC

## 2023-08-15 NOTE — Progress Notes (Signed)
 Ronald Rivera   MRN:  [id number]  [birth year]   Provider: Elveria Rising NP-C Location of Care: 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 He has attention deficit hyperactivity disorder, combined type, and a well-controlled vocal and motor tic disorder. He takes generic Adderal for ADHD and Clonidine for tics.    Today's concerns: He reports today that the generic Adderall continues to work well to focus his attention for the work day. Ronald Rivera works for [employer] and says that is going well He reports that tics have not been problematic.  [name] has been otherwise generally healthy since he was last seen. No health concerns today other than previously mentioned.  Review of systems: Please see HPI for neurologic and other pertinent review of systems. Otherwise all other systems were reviewed and were negative.  Problem List: Patient Active Problem List   Diagnosis Date Noted   Autism spectrum disorder requiring support (level 1) 08/02/2015   Colitis 09/27/2014   Nasal congestion 11/11/2013   Conjunctivitis 11/11/2013   Other specified pervasive developmental disorders, current or active state 12/31/2012   Visit for preventive health examination 04/17/2012   Wears glasses 04/17/2012   History of allergy 03/12/2012   Acne 01/12/2011   ADHD (attention deficit hyperactivity disorder)    Tics of organic origin    NECK PAIN 03/10/2007     Past Medical History:  Diagnosis Date   ADHD (attention deficit hyperactivity disorder)    Autistic spectrum disorder    sees Dr Sharene Skeans   Hx of varicella    Left acute otitis media 03/12/2012   STREPTOCOCCAL PHARYNGITIS 04/03/2010   Qualifier:  Diagnosis of  By: Fabian Sharp MD, Neta Mends    Tics of organic origin    hx of    Past medical history comments: See HPI  Surgical history: No past surgical history on file.   Family history: family history includes Cancer in his paternal grandmother.   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  He enjoys playing the Xbox and working.    Social Drivers of Corporate investment banker Strain: Not on file  Food Insecurity: Not on file  Transportation Needs: Not on file  Physical Activity: Not on file  Stress: Not on file  Social Connections: Not on file  Intimate Partner Violence: Not on file   Past/failed meds:  Allergies: Allergies  Allergen Reactions   Abilify [Aripiprazole] Other (See Comments)    seizures   Aripiprazole Other (See Comments)    Causes seizures  Immunizations: Immunization History  Administered Date(s) Administered   Td 02/13/2007   Tdap 04/18/2017    Diagnostics/Screenings:  Physical Exam: Ht 5' 8.11" (1.73 m)   Wt 127 lb 6.4 oz (57.8 kg)   BMI 19.31 kg/m   General: Well developed, well nourished man, seated on exam table, in no evident distress Head: Head normocephalic and atraumatic.  Oropharynx benign. Neck: Supple Cardiovascular: Regular rate and rhythm, no murmurs Respiratory: Breath sounds clear to auscultation Musculoskeletal: No obvious deformities or  scoliosis Skin: No rashes or neurocutaneous lesions  Neurologic Exam Mental Status: Awake and fully alert.  Oriented to place and time. Language is concrete. Variable eye contact. Attention span, concentration, and fund of knowledge appropriate.  Mood and affect appropriate. Cranial Nerves: Fundoscopic exam reveals sharp disc margins.  Pupils equal, briskly reactive to light.  Turns to localize faces, objects and sounds in the periphery. Facial movements are symmetric Motor: Normal bulk and tone. Normal strength in all tested extremity muscles. Sensory: Intact to touch and temperature in all extremities.  Coordination: No dysmetria when reaching for objects. Romberg negative. Gait and Station: Arises from chair without difficulty.  Stance is normal. Gait demonstrates normal stride length and balance.   Able to heel, toe and tandem walk without difficulty.  Impression: Attention deficit hyperactivity disorder (ADHD), combined type - Plan: amphetamine-dextroamphetamine (ADDERALL) 20 MG tablet  Tics of organic origin - Plan: cloNIDine (CATAPRES) 0.1 MG tablet  Autism spectrum disorder requiring support (level 1)   Recommendations for plan of care: The patient's previous Epic records were reviewed. No recent diagnostic studies to be reviewed with the patient.  Plan until next visit: Continue medications as prescribed  Call for questions or concerns Return in about 1 year (around 08/14/2024).  The medication list was reviewed and reconciled. No changes were made in the prescribed medications today. A complete medication list was provided to the patient.  Allergies as of 08/15/2023       Reactions   Abilify [aripiprazole] Other (See Comments)   seizures   Aripiprazole Other (See Comments)   Causes seizures        Medication List        Accurate as of August 15, 2023 11:59 PM. If you have any questions, ask your nurse or doctor.          STOP taking these medications     amoxicillin-clavulanate 875-125 MG tablet Commonly known as: AUGMENTIN Stopped by: Elveria Rising       TAKE these medications    acetaminophen 500 MG tablet Commonly known as: TYLENOL Take 1,000 mg by mouth every 6 (six) hours as needed for headache.   amphetamine-dextroamphetamine 20 MG tablet Commonly known as: Adderall Take 1 tablet daily Start taking on: September 01, 2023 What changed:  These instructions start on September 01, 2023. If you are unsure what to do until then, ask your doctor or other care provider. Another medication with the same name was removed. Continue taking this medication, and follow the directions you see here. Changed by: Elveria Rising   cloNIDine 0.1 MG tablet Commonly known as: CATAPRES TAKE 1/2 TABLET BY MOUTH IN THE MORNING What changed: Another medication with the same name was removed. Continue taking this medication, and follow the directions you see here. Changed by: Elveria Rising   loratadine 10 MG tablet Commonly known as: CLARITIN Take 10 mg by mouth daily.   ondansetron 4 MG tablet Commonly known as: ZOFRAN Take 1 tablet (4 mg total) by mouth  every 6 (six) hours.      Total time spent with the patient was 20 minutes, of which 50% or more was spent in counseling and coordination of care.  Elveria Rising NP-C [facility] [address] [place], [place] [address] Ph. [phone] Fax [phone]

## 2023-08-15 NOTE — Patient Instructions (Signed)
 It was a pleasure to see you today!  Instructions for you until your next appointment are as follows: Continue your medications as prescribed Let me know if you have any questions or concerns Please sign up for MyChart if you have not done so. Please plan to return for follow up in 1 year or sooner if needed.  Feel free to contact our office during normal business hours at [phone] with questions or concerns. If there is no answer or the call is outside business hours, please leave a message and our clinic staff will call you back within the next business day.  If you have an urgent concern, please stay on the line for our after-hours answering service and ask for the on-call neurologist.     I also encourage you to use MyChart to communicate with me more directly. If you have not yet signed up for MyChart within [facility], the front desk staff can help you. However, please note that this inbox is NOT monitored on nights or weekends, and response can take up to 2 business days.  Urgent matters should be discussed with the on-call pediatric neurologist.   At [facility], we are committed to providing exceptional care. You will receive a patient satisfaction survey through text or email regarding your visit today. Your opinion is important to me. Comments are appreciated.

## 2023-08-17 ENCOUNTER — Encounter (INDEPENDENT_AMBULATORY_CARE_PROVIDER_SITE_OTHER): Payer: Self-pay | Admitting: Family

## 2023-09-08 ENCOUNTER — Other Ambulatory Visit (INDEPENDENT_AMBULATORY_CARE_PROVIDER_SITE_OTHER): Payer: Self-pay | Admitting: Family

## 2023-09-08 DIAGNOSIS — F902 Attention-deficit hyperactivity disorder, combined type: Secondary | ICD-10-CM

## 2023-09-08 MED ORDER — AMPHETAMINE-DEXTROAMPHETAMINE 20 MG PO TABS
ORAL_TABLET | ORAL | 0 refills | Status: DC
Start: 2023-09-08 — End: 2023-10-13

## 2023-09-08 NOTE — Telephone Encounter (Signed)
 Sent electronically

## 2023-09-08 NOTE — Telephone Encounter (Signed)
  Name of who is calling: gloria   Caller's Relationship to Patient: grandmother   Best contact number: [phone]  Provider they see: Blane Ohara   Reason for call: rx refill      PRESCRIPTION REFILL ONLY  Name of prescription: adderall   Pharmacy: [facility]

## 2023-09-24 NOTE — Progress Notes (Unsigned)
 No chief complaint on file.   HPI: Patient  Ronald Rivera  28 y.o. comes in today for Preventive Health Care visit   Last cpe 2020 last visit 22  Followed neurology for asd and adhd  Health Maintenance  Topic Date Due   HIV Screening  Never done   Hepatitis C Screening  Never done   COVID-19 Vaccine (1 - 2024-25 season) Never done   INFLUENZA VACCINE  01/16/2024   DTaP/Tdap/Td (3 - Td or Tdap) 04/19/2027   HPV VACCINES  Aged Out   Health Maintenance Review LIFESTYLE:  Exercise:   Tobacco/ETS: Alcohol:  Sugar beverages: Sleep: Drug use: no HH of  Work:    ROS:  GEN/ HEENT: No fever, significant weight changes sweats headaches vision problems hearing changes, CV/ PULM; No chest pain shortness of breath cough, syncope,edema  change in exercise tolerance. GI /GU: No adominal pain, vomiting, change in bowel habits. No blood in the stool. No significant GU symptoms. SKIN/HEME: ,no acute skin rashes suspicious lesions or bleeding. No lymphadenopathy, nodules, masses.  NEURO/ PSYCH:  No neurologic signs such as weakness numbness. No depression anxiety. IMM/ Allergy: No unusual infections.  Allergy .   REST of 12 system review negative except as per HPI   Past Medical History:  Diagnosis Date   ADHD (attention deficit hyperactivity disorder)    Autistic spectrum disorder    sees Dr Sharene Skeans   Hx of varicella    Left acute otitis media 03/12/2012   STREPTOCOCCAL PHARYNGITIS 04/03/2010   Qualifier: Diagnosis of  By: Fabian Sharp MD, Neta Mends    Tics of organic origin    hx of    No past surgical history on file.  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  He enjoys playing the Xbox and working.    Social Drivers of Corporate investment banker Strain: Not on file  Food Insecurity: Not on file  Transportation Needs: Not on file  Physical Activity: Not on file  Stress: Not on file  Social Connections: Not on file    Outpatient Medications Prior to Visit  Medication Sig Dispense Refill   acetaminophen (TYLENOL) 500 MG tablet Take 1,000 mg by mouth every 6 (six) hours as needed for headache.     amphetamine-dextroamphetamine (ADDERALL) 20 MG tablet Take 1 tablet daily 30 tablet 0   cloNIDine (CATAPRES) 0.1 MG tablet TAKE 1/2 TABLET BY MOUTH IN THE MORNING 45 tablet 3   loratadine (CLARITIN) 10 MG tablet Take 10 mg by mouth daily.     ondansetron (ZOFRAN) 4 MG tablet Take 1 tablet (4 mg total) by mouth  every 6 (six) hours. 12 tablet 0   No facility-administered medications prior to visit.     EXAM:  There were no vitals taken for this visit.  There is no height or weight on file to calculate BMI. Wt Readings from Last 3 Encounters:  08/15/23 127 lb 6.4 oz (57.8 kg)  03/02/23 135 lb (61.2 kg)  08/09/22 131 lb 6.4 oz (59.6 kg)    Physical Exam: Vital signs reviewed KGM:WNUU is a well-developed well-nourished alert cooperative    who appearsr stated age in no acute distress.  HEENT: normocephalic atraumatic , Eyes: PERRL EOM's full, conjunctiva clear, Nares: paten,t no deformity discharge or tenderness., Ears: no deformity EAC's clear TMs with normal landmarks. Mouth: clear OP, no lesions, edema.  Moist mucous membranes. Dentition in adequate repair. NECK:  supple without masses, thyromegaly or bruits. CHEST/PULM:  Clear to auscultation and percussion breath sounds equal no wheeze , rales or rhonchi. No chest wall deformities or tenderness. Breast: normal by inspection . No dimpling, discharge, masses, tenderness or discharge . CV: PMI is nondisplaced, S1 S2 no gallops, murmurs, rubs. Peripheral pulses are full without delay.No JVD .  ABDOMEN: Bowel sounds normal nontender  No guard or rebound, no hepato splenomegal no CVA tenderness.  No hernia. Extremtities:  No clubbing cyanosis or edema, no acute joint swelling or redness no focal atrophy NEURO:  Oriented x3, cranial nerves 3-12 appear to be intact, no obvious focal weakness,gait within normal limits no abnormal reflexes or asymmetrical SKIN: No acute rashes normal turgor, color, no bruising or petechiae. PSYCH: Oriented, good eye contact, no obvious depression anxiety, cognition and judgment appear normal. LN: no cervical axillary inguinal adenopathy  Lab Results  Component Value Date   WBC 9.5 03/02/2023   HGB 15.7 03/02/2023   HCT 45.9 03/02/2023   PLT 223 03/02/2023   GLUCOSE 112 (H) 03/02/2023   CHOL 140 09/27/2014   TRIG 83.0 09/27/2014   HDL 35.50 (L) 09/27/2014   LDLCALC 88 09/27/2014   ALT 16 03/02/2023   AST 23 03/02/2023   NA 135 03/02/2023   K 4.7 03/02/2023   CL 101 03/02/2023   CREATININE 0.92 03/02/2023   BUN 17 03/02/2023   CO2 25 03/02/2023   HGBA1C 5.3 07/17/2018    BP Readings from Last 3 Encounters:  08/15/23 122/62  03/02/23 127/80  08/09/22 116/78    Lab results reviewed with patient   ASSESSMENT AND PLAN:  Discussed the following assessment and plan:    ICD-10-CM   1. Visit for preventive health examination  Z00.00     2. Attention deficit hyperactivity disorder (ADHD), combined type  F90.2      No follow-ups on file.  Patient Care Team: Maricela Schreur, Neta Mends, MD as PCP - General (Internal Medicine) Deetta Perla, MD (Inactive)  (Neurology) Kengo Sturges, Neta Mends, MD There are no Patient Instructions on file for this visit.  Neta Mends. Maleek Craver M.D.

## 2023-09-25 ENCOUNTER — Encounter: Payer: Self-pay | Admitting: Internal Medicine

## 2023-09-25 ENCOUNTER — Ambulatory Visit: Payer: BC Managed Care – PPO | Admitting: Internal Medicine

## 2023-09-25 VITALS — BP 130/80 | HR 89 | Temp 97.8°F | Ht 68.0 in | Wt 128.4 lb

## 2023-09-25 DIAGNOSIS — Z0000 Encounter for general adult medical examination without abnormal findings: Secondary | ICD-10-CM

## 2023-09-25 DIAGNOSIS — Z79899 Other long term (current) drug therapy: Secondary | ICD-10-CM | POA: Diagnosis not present

## 2023-09-25 DIAGNOSIS — Z833 Family history of diabetes mellitus: Secondary | ICD-10-CM

## 2023-09-25 DIAGNOSIS — F902 Attention-deficit hyperactivity disorder, combined type: Secondary | ICD-10-CM | POA: Diagnosis not present

## 2023-09-25 DIAGNOSIS — E786 Lipoprotein deficiency: Secondary | ICD-10-CM | POA: Diagnosis not present

## 2023-09-25 LAB — TSH: TSH: 1.12 u[IU]/mL (ref 0.35–5.50)

## 2023-09-25 LAB — LIPID PANEL
Cholesterol: 132 mg/dL (ref 0–200)
HDL: 52.5 mg/dL (ref >39.00)
LDL Cholesterol: 74 mg/dL (ref 0–99)
NonHDL: 79.47
Total CHOL/HDL Ratio: 3
Triglycerides: 27 mg/dL (ref 0.0–149.0)
VLDL: 5.4 mg/dL (ref 0.0–40.0)

## 2023-09-25 LAB — CBC WITH DIFFERENTIAL/PLATELET
Basophils Absolute: 0.1 10*3/uL (ref 0.0–0.1)
Basophils Relative: 0.9 % (ref 0.0–3.0)
Eosinophils Absolute: 0.1 10*3/uL (ref 0.0–0.7)
Eosinophils Relative: 1.3 % (ref 0.0–5.0)
HCT: 44 % (ref 39.0–52.0)
Hemoglobin: 14.9 g/dL (ref 13.0–17.0)
Lymphocytes Relative: 17.3 % (ref 12.0–46.0)
Lymphs Abs: 1 10*3/uL (ref 0.7–4.0)
MCHC: 33.8 g/dL (ref 30.0–36.0)
MCV: 86.8 fl (ref 78.0–100.0)
Monocytes Absolute: 0.5 10*3/uL (ref 0.1–1.0)
Monocytes Relative: 9.2 % (ref 3.0–12.0)
Neutro Abs: 4.1 10*3/uL (ref 1.4–7.7)
Neutrophils Relative %: 71.3 % (ref 43.0–77.0)
Platelets: 278 10*3/uL (ref 150.0–400.0)
RBC: 5.06 Mil/uL (ref 4.22–5.81)
RDW: 13.9 % (ref 11.5–15.5)
WBC: 5.7 10*3/uL (ref 4.0–10.5)

## 2023-09-25 LAB — COMPREHENSIVE METABOLIC PANEL WITH GFR
ALT: 13 U/L (ref 0–53)
AST: 21 U/L (ref 0–37)
Albumin: 4.9 g/dL (ref 3.5–5.2)
Alkaline Phosphatase: 74 U/L (ref 39–117)
BUN: 18 mg/dL (ref 6–23)
CO2: 25 meq/L (ref 19–32)
Calcium: 9.5 mg/dL (ref 8.4–10.5)
Chloride: 106 meq/L (ref 96–112)
Creatinine, Ser: 0.89 mg/dL (ref 0.40–1.50)
GFR: 115.54 mL/min (ref >60.00)
Glucose, Bld: 106 mg/dL — ABNORMAL HIGH (ref 70–99)
Potassium: 4.1 meq/L (ref 3.5–5.1)
Sodium: 138 meq/L (ref 135–145)
Total Bilirubin: 0.5 mg/dL (ref 0.2–1.2)
Total Protein: 7.2 g/dL (ref 6.0–8.3)

## 2023-09-25 LAB — HEMOGLOBIN A1C: Hgb A1c MFr Bld: 5.7 % (ref 4.6–6.5)

## 2023-09-25 NOTE — Patient Instructions (Addendum)
 Good to see you today  Updating labs to include cholesterol panel and blood sugar   and thryoid an kidney function.  Continue lifestyle intervention healthy eating and exercise .   If all ok then yearly physical .

## 2023-09-26 ENCOUNTER — Encounter: Payer: Self-pay | Admitting: Internal Medicine

## 2023-09-26 NOTE — Progress Notes (Signed)
 Blood sugar  borderline  elevated but no diabetes  Cholesterol level improve from past . Continue lifestyle intervention healthy eating and exercise .   And limit simple sugars sweets and processed eating.   Yearly check up

## 2023-10-13 ENCOUNTER — Other Ambulatory Visit (INDEPENDENT_AMBULATORY_CARE_PROVIDER_SITE_OTHER): Payer: Self-pay | Admitting: Family

## 2023-10-13 DIAGNOSIS — F902 Attention-deficit hyperactivity disorder, combined type: Secondary | ICD-10-CM

## 2023-10-13 MED ORDER — AMPHETAMINE-DEXTROAMPHETAMINE 20 MG PO TABS: ORAL_TABLET | ORAL | 0 refills | Status: DC

## 2023-10-13 NOTE — Telephone Encounter (Signed)
 Who's calling (name and relationship to patient) : Ronald Rivera; Guardian  Best contact number: [phone]  Provider they see: Lyndol Santee, NP  Reason for call: Ronald Bigness is requesting a refill for generic Adderall.    Call ID:      PRESCRIPTION REFILL ONLY  Name of prescription:  Pharmacy:

## 2023-11-14 ENCOUNTER — Telehealth (INDEPENDENT_AMBULATORY_CARE_PROVIDER_SITE_OTHER): Payer: Self-pay | Admitting: Family

## 2023-11-14 DIAGNOSIS — F902 Attention-deficit hyperactivity disorder, combined type: Secondary | ICD-10-CM

## 2023-11-14 MED ORDER — AMPHETAMINE-DEXTROAMPHETAMINE 20 MG PO TABS
ORAL_TABLET | ORAL | 0 refills | Status: DC
Start: 2023-11-14 — End: 2023-12-15

## 2023-11-14 NOTE — Telephone Encounter (Signed)
  Name of who is calling: Carleen Chary Relationship to Patient: Grandmother  Best contact number: [phone]  Provider they see: Nila Barth  Reason for call: Art Bigness is calling to get a refill on Wlliam's prescription.     PRESCRIPTION REFILL ONLY  Name of prescription: ADDERALL   [facility]

## 2023-11-14 NOTE — Telephone Encounter (Signed)
 Sent electronically TG

## 2023-12-15 ENCOUNTER — Other Ambulatory Visit (INDEPENDENT_AMBULATORY_CARE_PROVIDER_SITE_OTHER): Payer: Self-pay | Admitting: Family

## 2023-12-15 DIAGNOSIS — F902 Attention-deficit hyperactivity disorder, combined type: Secondary | ICD-10-CM

## 2023-12-15 MED ORDER — AMPHETAMINE-DEXTROAMPHETAMINE 20 MG PO TABS
ORAL_TABLET | ORAL | 0 refills | Status: DC
Start: 2023-12-15 — End: 2024-01-14

## 2023-12-15 NOTE — Telephone Encounter (Signed)
 Sent electronically

## 2023-12-15 NOTE — Telephone Encounter (Signed)
  Name of who is calling: Meade Millard Relationship to Patient: grandmother  Best contact number: [phone]  Provider they see: [place]  Reason for call: Rx refill     PRESCRIPTION REFILL ONLY  Name of prescription: Adderall  [facility] [address]

## 2024-01-14 ENCOUNTER — Other Ambulatory Visit (INDEPENDENT_AMBULATORY_CARE_PROVIDER_SITE_OTHER): Payer: Self-pay | Admitting: Family

## 2024-01-14 DIAGNOSIS — F902 Attention-deficit hyperactivity disorder, combined type: Secondary | ICD-10-CM

## 2024-01-14 DIAGNOSIS — G2569 Other tics of organic origin: Secondary | ICD-10-CM

## 2024-01-14 MED ORDER — AMPHETAMINE-DEXTROAMPHETAMINE 20 MG PO TABS: ORAL_TABLET | ORAL | 0 refills | Status: DC

## 2024-01-14 MED ORDER — CLONIDINE HCL 0.1 MG PO TABS
ORAL_TABLET | ORAL | 2 refills | Status: AC
Start: 2024-01-14 — End: ?

## 2024-01-14 NOTE — Telephone Encounter (Signed)
 Grandma called in to request a refill for the patient's Adderall. Please reach out with confirmation.

## 2024-02-17 ENCOUNTER — Other Ambulatory Visit (INDEPENDENT_AMBULATORY_CARE_PROVIDER_SITE_OTHER): Payer: Self-pay | Admitting: Family

## 2024-02-17 DIAGNOSIS — F902 Attention-deficit hyperactivity disorder, combined type: Secondary | ICD-10-CM

## 2024-02-17 MED ORDER — AMPHETAMINE-DEXTROAMPHETAMINE 20 MG PO TABS: ORAL_TABLET | ORAL | 0 refills | Status: DC

## 2024-02-17 NOTE — Telephone Encounter (Signed)
 Who's calling (name and relationship to patient) : Ronald Rivera; grandmother  Best contact number: [phone]  Provider they see: [place], [place]   Reason for call: Ronald called in to get a Rx refill(Adderall)    Call ID:      PRESCRIPTION REFILL ONLY  Name of prescription:  Pharmacy:

## 2024-04-01 ENCOUNTER — Other Ambulatory Visit (INDEPENDENT_AMBULATORY_CARE_PROVIDER_SITE_OTHER): Payer: Self-pay | Admitting: Family

## 2024-04-01 DIAGNOSIS — F902 Attention-deficit hyperactivity disorder, combined type: Secondary | ICD-10-CM

## 2024-04-01 MED ORDER — AMPHETAMINE-DEXTROAMPHETAMINE 20 MG PO TABS
ORAL_TABLET | ORAL | 0 refills | Status: AC
Start: 2024-04-01 — End: ?

## 2024-04-01 NOTE — Telephone Encounter (Signed)
 Grandmother called wanting rx filled to the same pharmacy, amphetamine -dextroamphetamine  (ADDERALL) 20 MG tablet

## 2024-08-19 ENCOUNTER — Ambulatory Visit (INDEPENDENT_AMBULATORY_CARE_PROVIDER_SITE_OTHER): Payer: Self-pay | Admitting: Family
